# Patient Record
Sex: Male | Born: 1990 | Race: Black or African American | Hispanic: No | Marital: Single | Smoking: Heavy tobacco smoker
Health system: Southern US, Community
[De-identification: ages and names within clinical notes are randomized; demographics above are authoritative.]

## PROBLEM LIST (undated history)

## (undated) DIAGNOSIS — N289 Disorder of kidney and ureter, unspecified: Secondary | ICD-10-CM

## (undated) DIAGNOSIS — G43909 Migraine, unspecified, not intractable, without status migrainosus: Secondary | ICD-10-CM

---

## 2018-04-04 ENCOUNTER — Emergency Department (HOSPITAL_COMMUNITY)
Admission: EM | Admit: 2018-04-04 | Discharge: 2018-04-05 | Disposition: A | Discharge status: Home / Self Care | Payer: Self-pay | Attending: Emergency Medicine | Admitting: Emergency Medicine

## 2018-04-04 ENCOUNTER — Other Ambulatory Visit: Payer: Self-pay

## 2018-04-04 ENCOUNTER — Encounter (HOSPITAL_COMMUNITY): Payer: Self-pay

## 2018-04-04 DIAGNOSIS — R110 Nausea: Secondary | ICD-10-CM | POA: Insufficient documentation

## 2018-04-04 DIAGNOSIS — K920 Hematemesis: Secondary | ICD-10-CM | POA: Insufficient documentation

## 2018-04-04 DIAGNOSIS — R1012 Left upper quadrant pain: Secondary | ICD-10-CM | POA: Insufficient documentation

## 2018-04-04 HISTORY — DX: Migraine, unspecified, not intractable, without status migrainosus: G43.909

## 2018-04-04 HISTORY — DX: Disorder of kidney and ureter, unspecified: N28.9

## 2018-04-04 MED ORDER — GI COCKTAIL ~~LOC~~
30.0000 mL | Freq: Once | ORAL | Status: AC
  Administered 2018-04-04: 30 mL via ORAL
  Filled 2018-04-04: qty 30

## 2018-04-04 MED ORDER — SODIUM CHLORIDE 0.9 % IV BOLUS
1000.0000 mL | Freq: Once | INTRAVENOUS | Status: AC
  Administered 2018-04-04: 1000 mL via INTRAVENOUS

## 2018-04-04 MED ORDER — METOCLOPRAMIDE HCL 5 MG/ML IJ SOLN
10.0000 mg | Freq: Once | INTRAMUSCULAR | Status: AC
  Administered 2018-04-04: 10 mg via INTRAVENOUS
  Filled 2018-04-04: qty 2

## 2018-04-04 NOTE — ED Triage Notes (Signed)
Patient BIB EMS for vomiting that started at about 2000-2100. Patient was concerned due to seeing "blood type stuff" in his vomit, and having a burning sensation is his LUQ. EMS reports there is no blood present with them. Patient admits to smoking and drinking hennessey all day today.   Vitals 132/84 HR 110 RR 18

## 2018-04-04 NOTE — ED Provider Notes (Signed)
[facility]-EMERGENCY DEPT Provider Note  CSN: [id number] Arrival date & time: 04/04/18 2221  Chief Complaint(s) No chief complaint on file.  HPI Michael Golden is a 27 y.o. male   The history is provided by the patient.  Abdominal Pain   This is a new problem. Episode onset: 3-4 hrs. The problem occurs constantly. Progression since onset: fluctuating. The pain is associated with an unknown factor. The pain is located in the LUQ. The quality of the pain is aching, cramping and sharp. The pain is moderate. Associated symptoms include diarrhea (watery, nonbloody), nausea and vomiting. Pertinent negatives include fever, flatus, hematochezia, melena, constipation, dysuria and headaches. Associated symptoms comments: Streaky hematemesis. The symptoms are aggravated by palpation and certain positions. Nothing relieves the symptoms.   Had 5th of liquor all day today. Not a daily drinker. Also used marijuana, daily user.   Past Medical History Past Medical History:  Diagnosis Date  . Migraines   . Renal disorder    Patient states he overused ibuprofen that led to kidney damage   There are no active problems to display for this patient.  Home Medication(s) Prior to Admission medications   Medication Sig Start Date End Date Taking? Authorizing Provider  metoCLOPramide (REGLAN) 10 MG tablet Take 1 tablet (10 mg total) by mouth every 8 (eight) hours as needed for up to 5 days for nausea. 04/05/18 04/10/18  Nira Connardama, Kilea Mccarey Eduardo, MD                                                                                                                                    Past Surgical History History reviewed. No pertinent surgical history. Family History Family History  Problem Relation Age of Onset  . Migraines Mother   . Migraines Father     Social History Social History   Tobacco Use  . Smoking status: Heavy Tobacco Smoker    Packs/day: 1.00    Years: 10.00    Pack  years: 10.00    Types: Cigarettes  . Smokeless tobacco: Never Used  Substance Use Topics  . Alcohol use: Not on file    Comment: 1/5 hennessey per weekend  . Drug use: Yes    Frequency: 20.0 times per week    Types: Marijuana   Allergies Ibuprofen  Review of Systems Review of Systems  Constitutional: Negative for fever.  Gastrointestinal: Positive for abdominal pain, diarrhea (watery, nonbloody), nausea and vomiting. Negative for constipation, flatus, hematochezia and melena.  Genitourinary: Negative for dysuria.  Neurological: Negative for headaches.   All other systems are reviewed and are negative for acute change except as noted in the HPI  Physical Exam Vital Signs  I have reviewed the triage vital signs BP 134/90 (BP Location: Right Arm)   Pulse (!) 109   Temp 98.4 F (36.9 C) (Oral)   Resp 18   Ht 5\' 6"  (1.676 m)   Wt 68 kg (  150 lb)   SpO2 98%   BMI 24.21 kg/m   Physical Exam  Constitutional: He is oriented to person, place, and time. He appears well-developed and well-nourished. No distress.  HENT:  Head: Normocephalic and atraumatic.  Nose: Nose normal.  Eyes: Pupils are equal, round, and reactive to light. Conjunctivae and EOM are normal. Right eye exhibits no discharge. Left eye exhibits no discharge. No scleral icterus.  Neck: Normal range of motion. Neck supple.  Cardiovascular: Normal rate and regular rhythm. Exam reveals no gallop and no friction rub.  No murmur heard. Pulmonary/Chest: Effort normal and breath sounds normal. No stridor. No respiratory distress. He has no rales.  Abdominal: Soft. He exhibits no distension. There is tenderness in the epigastric area, periumbilical area and left upper quadrant. There is no rigidity, no rebound, no guarding and no CVA tenderness. No hernia.  Musculoskeletal: He exhibits no edema or tenderness.  Neurological: He is alert and oriented to person, place, and time.  Skin: Skin is warm and dry. No rash noted. He  is not diaphoretic. No erythema.  Psychiatric: He has a normal mood and affect.  Vitals reviewed.   ED Results and Treatments Labs (all labs ordered are listed, but only abnormal results are displayed) Labs Reviewed  COMPREHENSIVE METABOLIC PANEL - Abnormal; Notable for the following components:      Result Value   Total Protein 8.2 (*)    Total Bilirubin 1.5 (*)    All other components within normal limits  CBC WITH DIFFERENTIAL/PLATELET  LIPASE, BLOOD                                                                                                                         EKG  EKG Interpretation  Date/Time:    Ventricular Rate:    PR Interval:    QRS Duration:   QT Interval:    QTC Calculation:   R Axis:     Text Interpretation:        Radiology No results found. Pertinent labs & imaging results that were available during my care of the patient were reviewed by me and considered in my medical decision making (see chart for details).  Medications Ordered in ED Medications  sodium chloride 0.9 % bolus 1,000 mL (1,000 mLs Intravenous New Bag/Given 04/04/18 2351)  gi cocktail (Maalox,Lidocaine,Donnatal) (30 mLs Oral Given 04/04/18 2353)  metoCLOPramide (REGLAN) injection 10 mg (10 mg Intravenous Given 04/04/18 2352)  Procedures Procedures  (including critical care time)  Medical Decision Making / ED Course I have reviewed the nursing notes for this encounter and the patient's prior records (if available in EHR or on provided paperwork).    Patient with epigastric and left upper quadrant abdominal pain.  No suspicious food intake.  Patient endorsed alcohol and marijuana use today.  Patient with tenderness to palpation of the epigastrium and left upper quadrant.  No signs of peritonitis.  Patient is afebrile with stable vital signs.  Screening labs  grossly reassuring without leukocytosis, significant electrolyte derangements or renal insufficiency.  No biliary obstruction or evidence of pancreatitis.  Patient provided with symptomatic management including IV hydration and Reglan as well as GI cocktail.  Following treatment patient had significant improvement in his symptomatology.  Repeat abdominal exam benign. Low suspicion for serious intra-abdominal inflammatory/infectious process requiring advanced imaging at this time.  Patient able to tolerate oral challenge.  The patient appears reasonably screened and/or stabilized for discharge and I doubt any other medical condition or other [facility] requiring further screening, evaluation, or treatment in the ED at this time prior to discharge.   Final Clinical Impression(s) / ED Diagnoses Final diagnoses:  Left upper quadrant pain  Hematemesis with nausea   Disposition: Discharge  Condition: Good  I have discussed the results, Dx and Tx plan with the patient who expressed understanding and agree(s) with the plan. Discharge instructions discussed at great length. The patient was given strict return precautions who verbalized understanding of the instructions. No further questions at time of discharge.    ED Discharge Orders        Ordered    metoCLOPramide (REGLAN) 10 MG tablet  Every 8 hours PRN     04/05/18 0159       Follow Up: Primary care provider  Schedule an appointment as soon as possible for a visit  If you do not have a primary care physician, contact [facility] at [phone] for referral      This chart was dictated using voice recognition software.  Despite best efforts to proofread,  errors can occur which can change the documentation meaning.   Nira Conn, MD 04/05/18 0200

## 2018-04-04 NOTE — ED Notes (Signed)
Bed: WA21 Expected date:  Expected time:  Means of arrival:  Comments: 425 M Hematemesis

## 2018-04-05 LAB — COMPREHENSIVE METABOLIC PANEL
ALBUMIN: 4.6 g/dL (ref 3.5–5.0)
ALT: 18 U/L (ref 17–63)
ANION GAP: 8 (ref 5–15)
AST: 22 U/L (ref 15–41)
Alkaline Phosphatase: 72 U/L (ref 38–126)
BUN: 12 mg/dL (ref 6–20)
CO2: 26 mmol/L (ref 22–32)
Calcium: 9.2 mg/dL (ref 8.9–10.3)
Chloride: 107 mmol/L (ref 101–111)
Creatinine, Ser: 1.09 mg/dL (ref 0.61–1.24)
GFR calc Af Amer: >60 mL/min (ref >60)
GFR calc non Af Amer: >60 mL/min (ref >60)
GLUCOSE: 85 mg/dL (ref 65–99)
POTASSIUM: 3.7 mmol/L (ref 3.5–5.1)
SODIUM: 141 mmol/L (ref 135–145)
TOTAL PROTEIN: 8.2 g/dL — AB (ref 6.5–8.1)
Total Bilirubin: 1.5 mg/dL — ABNORMAL HIGH (ref 0.3–1.2)

## 2018-04-05 LAB — CBC WITH DIFFERENTIAL/PLATELET
BASOS ABS: 0 10*3/uL (ref 0.0–0.1)
Basophils Relative: 1 %
EOS PCT: 8 %
Eosinophils Absolute: 0.4 10*3/uL (ref 0.0–0.7)
HCT: 45.1 % (ref 39.0–52.0)
Hemoglobin: 15.2 g/dL (ref 13.0–17.0)
LYMPHS PCT: 45 %
Lymphs Abs: 2.4 10*3/uL (ref 0.7–4.0)
MCH: 28.1 pg (ref 26.0–34.0)
MCHC: 33.7 g/dL (ref 30.0–36.0)
MCV: 83.4 fL (ref 78.0–100.0)
MONO ABS: 0.4 10*3/uL (ref 0.1–1.0)
MONOS PCT: 8 %
NEUTROS ABS: 2 10*3/uL (ref 1.7–7.7)
Neutrophils Relative %: 38 %
PLATELETS: 331 10*3/uL (ref 150–400)
RBC: 5.41 MIL/uL (ref 4.22–5.81)
RDW: 13.9 % (ref 11.5–15.5)
WBC: 5.3 10*3/uL (ref 4.0–10.5)

## 2018-04-05 LAB — LIPASE, BLOOD: Lipase: 34 U/L (ref 11–51)

## 2018-04-05 MED ORDER — METOCLOPRAMIDE HCL 10 MG PO TABS: 10.0000 mg | ORAL_TABLET | Freq: Three times a day (TID) | ORAL | 0 refills | Status: DC | PRN

## 2018-04-05 NOTE — Discharge Instructions (Signed)

## 2018-04-05 NOTE — ED Notes (Signed)
Patient tolerated PO Fluids.

## 2019-01-01 ENCOUNTER — Emergency Department (HOSPITAL_COMMUNITY)
Admission: EM | Admit: 2019-01-01 | Discharge: 2019-01-01 | Disposition: A | Discharge status: Home / Self Care | Payer: Self-pay | Attending: Emergency Medicine | Admitting: Emergency Medicine

## 2019-01-01 ENCOUNTER — Emergency Department (HOSPITAL_COMMUNITY): Payer: Self-pay

## 2019-01-01 ENCOUNTER — Other Ambulatory Visit: Payer: Self-pay

## 2019-01-01 DIAGNOSIS — Y999 Unspecified external cause status: Secondary | ICD-10-CM | POA: Insufficient documentation

## 2019-01-01 DIAGNOSIS — S39012A Strain of muscle, fascia and tendon of lower back, initial encounter: Secondary | ICD-10-CM | POA: Insufficient documentation

## 2019-01-01 DIAGNOSIS — Y9389 Activity, other specified: Secondary | ICD-10-CM | POA: Insufficient documentation

## 2019-01-01 DIAGNOSIS — R109 Unspecified abdominal pain: Secondary | ICD-10-CM

## 2019-01-01 DIAGNOSIS — Y929 Unspecified place or not applicable: Secondary | ICD-10-CM | POA: Insufficient documentation

## 2019-01-01 DIAGNOSIS — F17210 Nicotine dependence, cigarettes, uncomplicated: Secondary | ICD-10-CM | POA: Insufficient documentation

## 2019-01-01 DIAGNOSIS — X500XXA Overexertion from strenuous movement or load, initial encounter: Secondary | ICD-10-CM | POA: Insufficient documentation

## 2019-01-01 LAB — CBC WITH DIFFERENTIAL/PLATELET
Abs Immature Granulocytes: 0.02 10*3/uL (ref 0.00–0.07)
BASOS PCT: 0 %
Basophils Absolute: 0 10*3/uL (ref 0.0–0.1)
EOS ABS: 0.3 10*3/uL (ref 0.0–0.5)
EOS PCT: 4 %
HEMATOCRIT: 47.8 % (ref 39.0–52.0)
Hemoglobin: 15.1 g/dL (ref 13.0–17.0)
IMMATURE GRANULOCYTES: 0 %
LYMPHS ABS: 2.2 10*3/uL (ref 0.7–4.0)
Lymphocytes Relative: 31 %
MCH: 27.1 pg (ref 26.0–34.0)
MCHC: 31.6 g/dL (ref 30.0–36.0)
MCV: 85.8 fL (ref 80.0–100.0)
MONOS PCT: 12 %
Monocytes Absolute: 0.9 10*3/uL (ref 0.1–1.0)
NEUTROS PCT: 53 %
Neutro Abs: 3.8 10*3/uL (ref 1.7–7.7)
PLATELETS: 318 10*3/uL (ref 150–400)
RBC: 5.57 MIL/uL (ref 4.22–5.81)
RDW: 13.2 % (ref 11.5–15.5)
WBC: 7.2 10*3/uL (ref 4.0–10.5)
nRBC: 0 % (ref 0.0–0.2)

## 2019-01-01 LAB — COMPREHENSIVE METABOLIC PANEL
ALT: 29 U/L (ref 0–44)
ANION GAP: 11 (ref 5–15)
AST: 21 U/L (ref 15–41)
Albumin: 4 g/dL (ref 3.5–5.0)
Alkaline Phosphatase: 78 U/L (ref 38–126)
BILIRUBIN TOTAL: 1.7 mg/dL — AB (ref 0.3–1.2)
BUN: 12 mg/dL (ref 6–20)
CHLORIDE: 101 mmol/L (ref 98–111)
CO2: 26 mmol/L (ref 22–32)
Calcium: 9.3 mg/dL (ref 8.9–10.3)
Creatinine, Ser: 1.07 mg/dL (ref 0.61–1.24)
Glucose, Bld: 120 mg/dL — ABNORMAL HIGH (ref 70–99)
POTASSIUM: 4.1 mmol/L (ref 3.5–5.1)
Sodium: 138 mmol/L (ref 135–145)
TOTAL PROTEIN: 7.4 g/dL (ref 6.5–8.1)

## 2019-01-01 LAB — URINALYSIS, ROUTINE W REFLEX MICROSCOPIC
BILIRUBIN URINE: NEGATIVE
GLUCOSE, UA: NEGATIVE mg/dL
Ketones, ur: NEGATIVE mg/dL
LEUKOCYTE UA: NEGATIVE
NITRITE: NEGATIVE
PROTEIN: NEGATIVE mg/dL
Specific Gravity, Urine: 1.024 (ref 1.005–1.030)
pH: 5 (ref 5.0–8.0)

## 2019-01-01 MED ORDER — METHOCARBAMOL 500 MG PO TABS
500.0000 mg | ORAL_TABLET | Freq: Once | ORAL | Status: AC
  Administered 2019-01-01: 500 mg via ORAL
  Filled 2019-01-01: qty 1

## 2019-01-01 MED ORDER — SODIUM CHLORIDE 0.9 % IV BOLUS
1000.0000 mL | Freq: Once | INTRAVENOUS | Status: AC
  Administered 2019-01-01: 1000 mL via INTRAVENOUS

## 2019-01-01 MED ORDER — FENTANYL CITRATE (PF) 100 MCG/2ML IJ SOLN
50.0000 ug | Freq: Once | INTRAMUSCULAR | Status: AC
  Administered 2019-01-01: 50 ug via INTRAVENOUS
  Filled 2019-01-01: qty 2

## 2019-01-01 MED ORDER — HYDROCODONE-ACETAMINOPHEN 5-325 MG PO TABS: 1.0000 | ORAL_TABLET | Freq: Four times a day (QID) | ORAL | 0 refills | Status: DC | PRN

## 2019-01-01 MED ORDER — METHOCARBAMOL 500 MG PO TABS: 500.0000 mg | ORAL_TABLET | Freq: Two times a day (BID) | ORAL | 0 refills | Status: DC

## 2019-01-01 NOTE — ED Triage Notes (Signed)
Started with "Kidney Pain" yesterday morning on both sides.  Last similar event about 2013.

## 2019-01-01 NOTE — ED Notes (Signed)
Pt discharged from ED; instructions provided and scripts given; Pt encouraged to return to ED if symptoms worsen and to f/u with PCP; Pt verbalized understanding of all instructions 

## 2019-01-01 NOTE — Discharge Instructions (Signed)
1. Medications: robaxin, vicodin, usual home medications 2. Treatment: rest, drink plenty of fluids, gentle stretching as discussed, alternate ice and heat 3. Follow Up: Please followup with your primary doctor in 3 days for discussion of your diagnoses and further evaluation after today's visit; if you do not have a primary care doctor use the resource guide provided to find one;  Return to the ER for worsening back pain, difficulty walking, loss of bowel or bladder control or other concerning symptoms

## 2019-01-01 NOTE — ED Provider Notes (Signed)
[facility] EMERGENCY DEPARTMENT Provider Note   CSN: [id number] Arrival date & time: 01/01/19  2542    History   Chief Complaint Chief Complaint  Patient presents with  . Flank Pain    HPI Michael Golden is a 28 y.o. male with a hx of migraine headaches, previous AKI secondary to ibuprofen usage presents to the Emergency Department complaining of gradual, persistent, progressively worsening bilateral flank pain onset yesterday morning.  Patient reports 10/10 pain that has made it difficult to sleep.  He reports movement and palpation make the pain significantly worse.  He denies urinary symptoms including dysuria, hematuria, urinary urgency and urinary frequency.  Patient reports he did have some abdominal pain 2 days ago for which he saw his primary care.  He reports at that time he was told that he did have some blood in his urine which he has not had before.  Patient reports his abdominal pain resolved and he has had no additional symptoms.  Patient does report that he does a significant amount of heavy lifting and pulling for work but denies known activities for which he would have injured his back.  He denies numbness, tingling, weakness, loss of bowel or bladder control.  Denies fever, chills, headache, neck pain, nausea, vomiting, diarrhea, weakness, dizziness, syncope.     The history is provided by the patient and medical records. No language interpreter was used.    Past Medical History:  Diagnosis Date  . Migraines   . Renal disorder    Patient states he overused ibuprofen that led to kidney damage    There are no active problems to display for this patient.   No past surgical history on file.      Home Medications    Prior to Admission medications   Medication Sig Start Date End Date Taking? Authorizing Provider  HYDROcodone-acetaminophen (NORCO/VICODIN) 5-325 MG tablet Take 1 tablet by mouth every 6 (six) hours as needed. 01/01/19    Srikar Chiang, Dahlia Client, PA-C  methocarbamol (ROBAXIN) 500 MG tablet Take 1 tablet (500 mg total) by mouth 2 (two) times daily. 01/01/19   Chelsee Hosie, Dahlia Client, PA-C  metoCLOPramide (REGLAN) 10 MG tablet Take 1 tablet (10 mg total) by mouth every 8 (eight) hours as needed for up to 5 days for nausea. Patient not taking: Reported on 01/01/2019 04/05/18 01/01/28  Nira Conn, MD    Family History Family History  Problem Relation Age of Onset  . Migraines Mother   . Migraines Father     Social History Social History   Tobacco Use  . Smoking status: Heavy Tobacco Smoker    Packs/day: 1.00    Years: 10.00    Pack years: 10.00    Types: Cigarettes  . Smokeless tobacco: Never Used  Substance Use Topics  . Alcohol use: Not on file    Comment: 1/5 hennessey per weekend  . Drug use: Yes    Frequency: 20.0 times per week    Types: Marijuana     Allergies   Ibuprofen   Review of Systems Review of Systems  Constitutional: Negative for appetite change, diaphoresis, fatigue, fever and unexpected weight change.  HENT: Negative for mouth sores.   Eyes: Negative for visual disturbance.  Respiratory: Negative for cough, chest tightness, shortness of breath and wheezing.   Cardiovascular: Negative for chest pain.  Gastrointestinal: Negative for abdominal pain, constipation, diarrhea, nausea and vomiting.  Endocrine: Negative for polydipsia, polyphagia and polyuria.  Genitourinary: Positive for flank pain. Negative for  dysuria, frequency, hematuria and urgency.  Musculoskeletal: Positive for back pain. Negative for neck stiffness.  Skin: Negative for rash.  Allergic/Immunologic: Negative for immunocompromised state.  Neurological: Negative for syncope, light-headedness and headaches.  Hematological: Does not bruise/bleed easily.  Psychiatric/Behavioral: Negative for sleep disturbance. The patient is not nervous/anxious.      Physical Exam Updated Vital Signs BP 118/69 (BP  Location: Right Arm) Comment: Simultaneous filing. User may not have seen previous data.  Pulse 63   Temp 98.4 F (36.9 C) (Oral)   Resp 18   Ht 5\' 6"  (1.676 m)   Wt 68 kg   SpO2 100%   BMI 24.21 kg/m   Physical Exam Vitals signs and nursing note reviewed.  Constitutional:      General: He is not in acute distress.    Appearance: He is well-developed. He is not diaphoretic.     Comments: Awake, alert, nontoxic appearance  HENT:     Head: Normocephalic and atraumatic.     Mouth/Throat:     Pharynx: No oropharyngeal exudate.  Eyes:     General: No scleral icterus.    Conjunctiva/sclera: Conjunctivae normal.  Neck:     Musculoskeletal: Normal range of motion and neck supple.     Comments: Full ROM without pain Cardiovascular:     Rate and Rhythm: Normal rate and regular rhythm.  Pulmonary:     Effort: Pulmonary effort is normal. No respiratory distress.     Breath sounds: Normal breath sounds. No wheezing.  Abdominal:     General: Bowel sounds are normal. There is no distension.     Palpations: Abdomen is soft. There is no mass.     Tenderness: There is no abdominal tenderness. There is right CVA tenderness and left CVA tenderness. There is no guarding or rebound.  Musculoskeletal: Normal range of motion.     Comments: Full range of motion T-spine and L-spine with pain.  No midline tenderness to palpation of the C-spine, T-spine or L-spine.  Tenderness to palpation along the bilateral flanks without ecchymosis or swelling.  Lymphadenopathy:     Cervical: No cervical adenopathy.  Skin:    General: Skin is warm and dry.     Findings: No erythema or rash.  Neurological:     Mental Status: He is alert.     Comments: Speech is clear and goal oriented, follows commands Normal 5/5 strength in upper and lower extremities bilaterally including dorsiflexion and plantar flexion, strong and equal grip strength Sensation normal to light and sharp touch Moves extremities without  ataxia, coordination intact Normal gait Normal balance  Psychiatric:        Behavior: Behavior normal.      ED Treatments / Results  Labs (all labs ordered are listed, but only abnormal results are displayed) Labs Reviewed  COMPREHENSIVE METABOLIC PANEL - Abnormal; Notable for the following components:      Result Value   Glucose, Bld 120 (*)    Total Bilirubin 1.7 (*)    All other components within normal limits  URINALYSIS, ROUTINE W REFLEX MICROSCOPIC - Abnormal; Notable for the following components:   Hgb urine dipstick MODERATE (*)    Bacteria, UA RARE (*)    All other components within normal limits  CBC WITH DIFFERENTIAL/PLATELET     Radiology Ct Renal Stone Study  Result Date: 01/01/2019 CLINICAL DATA:  Initial evaluation for acute bilateral flank pain, stone disease suspected. EXAM: CT ABDOMEN AND PELVIS WITHOUT CONTRAST TECHNIQUE: Multidetector CT imaging of the  abdomen and pelvis was performed following the standard protocol without IV contrast. COMPARISON:  None. FINDINGS: Lower chest: Visualized lung bases are clear. Hepatobiliary: Liver demonstrates a normal unenhanced appearance. Gallbladder normal. No biliary dilatation. Pancreas: Pancreas within normal limits. Spleen: Spleen within normal limits. Adrenals/Urinary Tract: Adrenal glands are normal. Kidneys equal in size without evidence for nephrolithiasis or hydronephrosis. No radiopaque calculi seen along the course of either renal collecting system. No hydroureter. Bladder largely decompressed without acute finding. No layering stones within the bladder lumen. Stomach/Bowel: Stomach within normal limits. No evidence for bowel obstruction. Normal appendix. No acute inflammatory changes seen about the bowels. Vascular/Lymphatic: Intra-abdominal aorta of normal caliber. No adenopathy. Reproductive: Prostate within normal limits. Other: No free air or fluid. Musculoskeletal: No acute osseous abnormality. No discrete lytic  or blastic osseous lesions. IMPRESSION: 1. No CT evidence for nephrolithiasis, ureterolithiasis, or obstructive uropathy. 2. No other acute intra-abdominal or pelvic process. Electronically Signed   By: Rise Mu M.D.   On: 01/01/2019 06:05    Procedures Procedures (including critical care time)  Medications Ordered in ED Medications  fentaNYL (SUBLIMAZE) injection 50 mcg (50 mcg Intravenous Given 01/01/19 0534)  sodium chloride 0.9 % bolus 1,000 mL (1,000 mLs Intravenous New Bag/Given 01/01/19 0530)  methocarbamol (ROBAXIN) tablet 500 mg (500 mg Oral Given 01/01/19 6553)     Initial Impression / Assessment and Plan / ED Course  I have reviewed the triage vital signs and the nursing notes.  Pertinent labs & imaging results that were available during my care of the patient were reviewed by me and considered in my medical decision making (see chart for details).        Presents to the emergency department with bilateral flank pain.  He reports a history of AKI secondary to large volume ibuprofen usage but reports that since that time he has not taken any ibuprofen.  Serum creatinine is within normal limits today at 1.07.  Glucose is slightly elevated at 120.  Slightly elevated total bili however this appears to be baseline for patient.  Urinalysis shows moderate hemoglobin rare bacteria but no evidence of urinary tract infection.  No leukocytosis.  CT renal is without evidence of nephrolithiasis or obstructive uropathy.  Patient has been able to urinate here in the emergency department and there is no signs of urinary retention.  Suspect musculoskeletal pain due to heavy lifting and pulling.  Patient's pain treated here in the emergency department.  He is able to ambulate without difficulty.  Final Clinical Impressions(s) / ED Diagnoses   Final diagnoses:  Bilateral flank pain  Strain of lumbar region, initial encounter    ED Discharge Orders         Ordered    methocarbamol  (ROBAXIN) 500 MG tablet  2 times daily     01/01/19 0640    HYDROcodone-acetaminophen (NORCO/VICODIN) 5-325 MG tablet  Every 6 hours PRN     01/01/19 0641           Kazandra Forstrom, Dahlia Client, PA-C 01/01/19 7482    Shaune Pollack, MD 01/02/19 1902

## 2019-07-22 ENCOUNTER — Telehealth: Payer: Self-pay | Admitting: *Deleted

## 2019-07-22 DIAGNOSIS — Z20828 Contact with and (suspected) exposure to other viral communicable diseases: Secondary | ICD-10-CM

## 2019-07-22 DIAGNOSIS — Z20822 Contact with and (suspected) exposure to covid-19: Secondary | ICD-10-CM

## 2019-07-22 NOTE — Telephone Encounter (Signed)
Pt was at work at [employer] this morning for approx 20 min when he developed sudden onset n/v/d. One episode each of v/d at work. He presented to supervisor who sent him home. Pt had WNL temp at checkpoint this morning prior to entering building. He endorses muted sense of taste while brushing his teeth this morning. Denies loss of smell. Denies HA, sinus pain/pressure, otalgia, sore throat, fatigue, muscle aches, nasal or chest congestion. Denies any known exposure to Covid-19 or attending any large gatherings in the past 2 weeks.   Pt advised COVID testing is recommended and that per company policy, he will be out of work on [employer] for 14 days. Pt is agreeable to testing. RN advised pt that since today is Day 1 of sx, testing is not advisable today or tomorrow. Due to testing sites being closed on weekends, asked pt to have testing performed on Monday 07/26/2019. He is agreeable. Directions given to preferred testing site of [place] in [place] with instructions to remain in vehicle and wear mask. Quarantine instructions given. Reviewed possible sx that could develop including ShOB, fever, HA, fatigue, muscle aches, sore throat, and when to seek same day or emergent care. Pt verbalizes understanding and agreement and has no further questions at this time. Will inform pt of results once received and have HR contact him regarding benefits and return to work date/instructions. Pt encouraged to call clinic with any questions or concerns that may arise.

## 2019-07-22 NOTE — Telephone Encounter (Signed)
Noted agreed with plan of care and order signed electronically.

## 2019-07-26 ENCOUNTER — Other Ambulatory Visit: Payer: Self-pay

## 2019-07-26 DIAGNOSIS — Z20822 Contact with and (suspected) exposure to covid-19: Secondary | ICD-10-CM

## 2019-07-27 LAB — NOVEL CORONAVIRUS, NAA: SARS-CoV-2, NAA: NOT DETECTED

## 2019-07-29 NOTE — Telephone Encounter (Signed)
Pt tested on 07/26/19, negative result 07/27/19. Pt viewed in MyChart per chart review. Tim Watson/HR notified of negative test on 07/28/2019 by phone and he reported having already spoken with pt regarding return to work plan.

## 2019-07-29 NOTE — Telephone Encounter (Signed)
Noted.  Agree with plan of care regarding patient to follow his company guidelines regarding return to work onsite in [place] per CDC/company protocol.  Patient previously briefed on covid symptoms to report to clinic staff and ER precautions.

## 2019-08-06 ENCOUNTER — Telehealth: Payer: Self-pay | Admitting: *Deleted

## 2019-08-06 DIAGNOSIS — Z20822 Contact with and (suspected) exposure to covid-19: Secondary | ICD-10-CM

## 2019-08-06 NOTE — Telephone Encounter (Signed)
Noted agree with plan of care.  ER precautions if vomiting blood, unable to tolerate clear liquids and void every 8 hours or worsening abdomen pain with or without black or bright red blood in stools.  Continue to monitor for other COVID symptoms and consider repeat covid testing if others in household/contacts symptomatic and/or not following safe practices.

## 2019-08-06 NOTE — Telephone Encounter (Signed)
RN was informed by Environmental consultant that pt presented to the temp/sx check point this morning to enter work but vomited upon approaching it. He was turned around and instructed to return home and contact HR. RN called pt and he reported 2 episodes of vomiting this morning on his way into work. Felt fine prior to leaving for work. Sudden onset of sx. Only n/v. Denies diarrhea, fatigue, body aches, cough, ShOB, sinus sx, etc. He vomited once more after returning home and then took a nap. Just woke up but feeling better. No additional vomiting yet. Nausea is mild. Pt was tested for Covid on 07/26/19 and was out of work for 14 days based on previous sx starting 07/22/19. Was supposed to return to work today. Previous sx had resolved by test date and he had no further or new sx during his quarantine.  Pt reports he ate take out pizza late last night. Wonders if bad food could be causing sx, or is a short term stomach virus. Pt scheduled to work today and tomorrow, then off Sunday and Monday. Advised pt to remain out of work today and Sales promotion account executive will call him on Monday to re-evaluate sx. As he is already feeling better this morning, if sx remain improved and no new sx develop, may be able to clear to return to work on Tuesday. Will discuss with NP Betancourt Monday after reevaluating pt over phone.  Advised pt of BRAT diet and small sips of water or electrolyte drink every 39min as fluid challenge. Avoid large intakes of fluids. Once able to tolerate 1 hour of fluid intake without n/v, may progress to bland food challenge. Again reviewed possible mild covid sx and more severe sx that would require same day treatment. Instructed to contact clinic if new sx occur or he had any questions or concerns. He verbalizes understanding and agreement with plan of care. No further questions. HR Hyacinth Meeker made aware of plan and is agreeable.

## 2019-08-09 NOTE — Telephone Encounter (Signed)
Per NP Betancourt, send for retesting. Pt advised to go today before 3:30 or tomorrow 8:30-3:30. Same location as previous, [employer]. NP to call pt tomorrow. Pt advised to answer private number. Will evaluate return to work date based on results and current sx when NP speaks with pt. He verbalizes understanding and agreement.

## 2019-08-09 NOTE — Telephone Encounter (Signed)
RN called pt this morning to check on sx. He reports the n/v resolved on Friday a few hours after he returned home. He initially denied diarrhea but today stated it had been occurring since Friday as well. Sts it is less watery than previous but still loose stools, 2-3 episodes in past 24 hours. Denies abd pain, cramping. No new or additional sx. He is able to tolerate fluids and food since Friday evening. Denies anyone else in household with similar sx or any illness. Reports everyone in household is abiding by covid precautions, avoiding large gatherings, wearing masks in public, keeping 15ft distance, etc.  Please advise on recommendations for repeat covid testing or return to work. Pt scheduled to return to work tomorrow if [employer] testing not indicated and sx do not worsen. He currently feels able to return if no testing indicated.

## 2019-08-09 NOTE — Telephone Encounter (Signed)
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  Discussed patient is not to return to work until vomiting/diarrhea resolved and no new symptoms consistent with covid e.g. fever, chills, body aches, runny nose, loss of taste or smell, sore throat.  Patient will remain out of work until test results received and if negative and his acute symptoms resolved will be able to return to work wearing mask, staying 6 feet apart from others and frequent handwashing/hand sanitizing.  He was instructed to notify clinic staff if any new symptoms via telephone.  If new symptoms quarantine will be extended.  HR Rep Hyacinth Meeker notified patient placed on home quarantine and SARS covid 19 PCR testing ordered today via telephone.

## 2019-08-10 ENCOUNTER — Other Ambulatory Visit: Payer: Self-pay

## 2019-08-10 DIAGNOSIS — Z20822 Contact with and (suspected) exposure to covid-19: Secondary | ICD-10-CM

## 2019-08-12 LAB — NOVEL CORONAVIRUS, NAA: SARS-CoV-2, NAA: NOT DETECTED

## 2019-08-15 ENCOUNTER — Encounter: Payer: Self-pay | Admitting: *Deleted

## 2019-08-15 NOTE — Telephone Encounter (Signed)
Second covid PCR test results still pending from last week.  Patient scheduled to return to work Tuesday from quarantine per [employer] HR Rep [employer].

## 2019-08-17 NOTE — Telephone Encounter (Signed)
See RN encounter note 08/17/19. Test resulted 08/12/19 but did not cross over to [facility]. [facility] confirmed negative result and faxed result to clinic. Pt to return to work tomorrow.

## 2019-08-17 NOTE — Telephone Encounter (Signed)
Noted agree with plan of care 

## 2019-08-17 NOTE — Telephone Encounter (Signed)
Covid test result still pending after 1 week. RN called [facility] to check status. They report test resulted on 08/12/19 as negative. Result has not crossed over to [facility]. [facility] faxed copy of result to clinic. Pt made aware of results over phone, as well as HR Hyacinth Meeker. Pt reports all sx resolved 4-5 days ago. No current complaints. Plan for pt to return to work tomorrow, 08/18/19.

## 2019-08-18 NOTE — Telephone Encounter (Signed)
noted 

## 2019-09-02 ENCOUNTER — Encounter: Payer: Self-pay | Admitting: Registered Nurse

## 2019-09-02 ENCOUNTER — Ambulatory Visit: Payer: Self-pay | Admitting: Registered Nurse

## 2019-09-02 ENCOUNTER — Other Ambulatory Visit: Payer: Self-pay

## 2019-09-02 VITALS — BP 118/75 | HR 76 | Temp 98.6°F

## 2019-09-02 DIAGNOSIS — M7651 Patellar tendinitis, right knee: Secondary | ICD-10-CM

## 2019-09-02 MED ORDER — ACETAMINOPHEN 500 MG PO TABS: 1000.0000 mg | ORAL_TABLET | Freq: Four times a day (QID) | ORAL | 0 refills | Status: AC | PRN

## 2019-09-02 NOTE — Patient Instructions (Addendum)
Patellar Tendinitis Rehab Ask your health care provider which exercises are safe for you. Do exercises exactly as told by your health care provider and adjust them as directed. It is normal to feel mild stretching, pulling, tightness, or discomfort as you do these exercises. Stop right away if you feel sudden pain or your pain gets worse. Do not begin these exercises until told by your health care provider. Stretching and range-of-motion exercise This exercise warms up your muscles and joints and improves the movement and flexibility of your knee. The exercise also helps to relieve pain and stiffness. Hamstring, doorway stretch This is an exercise in which you lie in a doorway and prop your leg on a wall to stretch the back of your knee and thigh (hamstring). 1. Lie on your back in front of a doorway with your left / right leg resting against the wall and your other leg flat on the floor in the doorway. There should be a slight bend in your left / right knee. 2. Straighten your left / right knee. You should feel a stretch behind your knee or thigh. If you do not, scoot your buttocks closer to the door. 3. Hold this position for _____5_____ seconds. Repeat _____3_____ times. Complete this exercise ___2_______ times a day. Strengthening exercises These exercises build strength and endurance in your knee. Endurance is the ability to use your muscles for a long time, even after they get tired. Quadriceps, isometric This exercise stretches the muscles in front of your thigh (quadriceps) without moving your knee joint (isometric). 1. Lie on your back with your left / right leg extended and your other knee bent. 2. Slowly tense the muscles in the front of your left / right thigh. When you do this, you should see your kneecap slide up toward your hip or see increased dimpling just above the knee. This motion will push the back of your knee toward the floor. If this is painful, try putting a rolled-up hand  towel under your knee to support it in a bent position. Change the size of the towel to find a position that allows you to do this exercise without any pain. 3. For ____5______ seconds, hold the muscle as tight as you can without increasing your pain. 4. Relax the muscles slowly and completely. Repeat _____3_____ times. Complete this exercise ___2_______ times a day. Straight leg raises This exercise stretches the muscles in front of your thigh (quadriceps) and the muscles that move your hips (hip flexors). 1. Lie on your back with your left / right leg extended and your other knee bent. 2. Tense the muscles in the front of your left / right thigh. When you do this, you should see your kneecap slide up or see increased dimpling just above the knee. 3. Keep these muscles tight as you raise your leg 4-6 inches (10-15 cm) off the floor. Do not let your moving knee bend. 4. Hold this position for ____5______ seconds. 5. Keep these muscles tense as you slowly lower your leg. 6. Relax your muscles slowly and completely. Repeat ____3______ times. Complete this exercise _____2____ times a day. Squats This is a weight-bearing exercise in which you bend your knees and lower your hips while engaging your thigh muscles. 1. Stand in front of a table, with your feet and knees pointing straight ahead. You may rest your hands on the table for balance but not for support. 2. Slowly bend your knees and lower your hips like you are going to  sit in a chair. ? Keep your weight over your heels, not over your toes. ? Keep your lower legs upright so they are parallel with the table legs. ? Do not let your hips go lower than your knees. ? Do not bend lower than told by your health care provider. ? If your knee pain increases, do not bend as low. 3. Hold the squat position for ____5______ seconds. 4. Slowly push with your legs to return to standing. Do not use your hands to pull yourself to standing. Repeat ___3______  times. Complete this exercise ____2______ times a day. Step-downs This is an exercise in which you step down slowly while engaging your leg muscles. 1. Stand on the edge of a step. 2. Keeping your weight over your _____right_____ heel, slowly bend your _____right_____ knee to bring your ____right______ heel toward the floor. Lower your heel as far as you can while keeping control and without increasing any discomfort. ? Do not let your __right________ knee come forward. ? Use your leg muscles, not gravity, to lower your body. ? Hold a wall or rail for balance if needed. 3. Slowly push through your heel to lift your body weight back up. 4. Return to the starting position. Repeat _____3_____ times. Complete this exercise _____2_____ times a day. Straight leg raises This exercise strengthens the muscles that rotate the leg at the hip and move it away from your body (hip abductors). 1. Lie on your side with your left / right leg in the top position. Lie so your head, shoulder, knee, and hip line up. You may bend your lower knee to help you keep your balance. 2. Roll your hips slightly forward, so that your hips are stacked directly over each other and your left / right knee is facing forward. 3. Leading with your heel, lift your top leg 4-6 inches (10-15 cm). You should feel the muscles in your outer hip lifting. ? Do not let your foot drift forward. ? Do not let your knee roll toward the ceiling. 4. Hold this position for ___5______ seconds. 5. Slowly lower your leg to the starting position. 6. Let your muscles relax completely after each repetition. Repeat ____3______ times. Complete this exercise ___2_______ times a day. This information is not intended to replace advice given to you by your health care provider. Make sure you discuss any questions you have with your health care provider. Document Released: 09/30/2005 Document Revised: 01/21/2019 Document Reviewed: 07/21/2018 Elsevier Patient  Education  2020 [employer] Patellar Tendinitis  Patellar tendinitis is also called jumper's knee or patellar tendinopathy. This condition happens when there is damage to and inflammation of the patellar tendon. Tendons are cord-like tissues that connect muscles to bones. The patellar tendon connects the bottom of the kneecap (patella) to the top of the shin bone (tibia). Patellar tendinitis causes pain in the front of the knee. The condition happens in the following stages:  Stage 1: In this stage, you have pain only after activity.  Stage 2: In this stage, you have pain during and after activity.  Stage 3: In this stage, you have pain at rest as well as during and after activity. The pain limits your ability to do the activity.  Stage 4: In this stage, the tendon tears and severely limits your activity. What are the causes? This condition is caused by repeated (repetitive) stress on the tendon. This stress may cause the tendon to stretch, swell, thicken, or tear. What increases the risk? The following  factors may make you more likely to develop this condition:  Participating in sports that involve running, kicking, and jumping, especially on hard surfaces. These include: ? Basketball. ? Volleyball. ? Soccer. ? Track and field.  Training too hard.  Having tight thigh muscles.  Having received steroid injections in the tendon.  Having had knee surgery.  Being 61-59 years old.  Having rheumatoid arthritis, diabetes, or kidney disease. These conditions interrupt blood flow to the knee, causing the tendon to weaken. What are the signs or symptoms? The main symptom of this condition is pain and swelling in the front of the knee. The pain usually starts slowly and gradually gets worse. It may become painful to straighten your leg. The pain may get worse when you walk, run, or jump. How is this diagnosed? This condition may be diagnosed based on:  Your symptoms.  Your medical  history.  A physical exam. During the physical exam, your health care provider may check for: ? Tenderness in your patella. ? Tightness in your thigh muscles. ? Pain when you straighten your knee.  Imaging tests, including: ? X-rays. These will show the position and condition of your patella. ? An MRI. This will show any abnormality of the tendon. ? Ultrasound. This will show any swelling or other abnormalities of the tendon. How is this treated? Treatment for this condition depends on the stage of the condition. It may involve:  Avoiding activities that cause pain, such as jumping.  Icing and elevating your knee.  Having sound wave stimulation to promote healing.  Doing stretching and strengthening exercises (physical therapy) when pain and swelling improve.  Wearing a knee brace. This may be needed if your condition does not improve with treatment.  Using crutches or a walker. This may be needed if your condition does not improve with treatment.  Surgery. This may be done if you have stage 4 tendinitis. Follow these instructions at home: If you have a brace:  Wear the brace as told by your health care provider. Remove it only as told by your health care provider.  Loosen the brace if your toes tingle, become numb, or turn cold and blue.  Keep the brace clean.  If the brace is not waterproof: ? Do not let it get wet. ? Cover it with a watertight covering when you take a bath or shower.  Ask your health care provider when it is safe for you to drive. Managing pain, stiffness, and swelling   If directed, put ice on the injured area. ? If you have a removable brace, remove it as told by your health care provider. ? Put ice in a plastic bag. ? Place a towel between your skin and the bag. ? Leave the ice on for 20 minutes, 2-3 times a day.  Move your toes often to reduce stiffness and swelling.  Raise (elevate) your knee above the level of your heart while you are  sitting or lying down. Activity  Do not use the injured limb to support your body weight until your health care provider says that you can. Use your crutches or a walker as told by your health care provider.  Return to your normal activities as told by your health care provider. Ask your health care provider what activities are safe for you.  Do exercises as told by your health care provider or physical therapist. General instructions  Take over-the-counter and prescription medicines only as told by your health care provider.  Do not  use any products that contain nicotine or tobacco, such as cigarettes, e-cigarettes, and chewing tobacco. These can delay healing. If you need help quitting, ask your health care provider.  Keep all follow-up visits as told by your health care provider. This is important. How is this prevented?  Warm up and stretch before being active.  Cool down and stretch after being active.  Give your body time to rest between periods of activity. ? You may need to reduce how often you play a sport that requires frequent jumping.  Make sure to use equipment that fits you.  Be safe and responsible while being active. This will help you avoid falls which can damage the tendon.  Do at least 150 minutes of moderate-intensity exercise each week, such as brisk walking or water aerobics.  Maintain physical fitness, including: ? Strength. ? Flexibility. ? Cardiovascular fitness. ? Endurance. Contact a health care provider if:  Your symptoms have not improved in 6 weeks.  Your symptoms get worse. Summary  Patellar tendinitis is also called jumper's knee or patellar tendinopathy. This condition happens when there is damage to and inflammation of the patellar tendon.  Treatment for this condition depends on the stage of the condition and may include rest, ice, exercises, medicines, and surgery.  Do not use the injured limb to support your body weight until your  health care provider says that you can.  Take over-the-counter and prescription medicines only as told by your health care provider.  Keep all follow-up visits as told by your health care provider. This is important. This information is not intended to replace advice given to you by your health care provider. Make sure you discuss any questions you have with your health care provider. Document Released: 09/30/2005 Document Revised: 01/21/2019 Document Reviewed: 08/24/2018 Elsevier Patient Education  2020 [employer].

## 2019-09-02 NOTE — Progress Notes (Signed)
Subjective:    Patient ID: Michael Golden, male    DOB: 1990/12/16, 28 y.o.   MRN: [id number]  27y/o male African-American newly established pt c/o R knee pain x2 days. No known injury. Feels like knee is "giving out and I have to catch myself before I fall. I had to take a knee while I was walking the other day." Notices pain and weakness in right knee mostly with upward movement, eg climbing ladders, moving from sitting to standing position. Points to source of pain as just proximal to patella. Feels like patella shifts more than it should. Has not done any home treatment. Applied Biofreeze in clinic this am from [employer].  Does not have reusable ice pack at home.  Denied previous injury to knees.   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 Negative for appetite change, chills, diaphoresis, fatigue and fever.  HENT: Negative for trouble swallowing and voice change.   Eyes: Negative for photophobia and visual disturbance.  Respiratory: Negative for cough, shortness of breath, wheezing and stridor.   Cardiovascular: Negative for chest pain, palpitations and leg swelling.  Gastrointestinal: Negative for abdominal pain, diarrhea, nausea and vomiting.  Endocrine: Negative for cold intolerance and heat intolerance.  Genitourinary: Negative for difficulty urinating and enuresis.  Musculoskeletal: Positive for arthralgias, gait problem, joint swelling and myalgias. Negative for back pain, neck pain and neck stiffness.  Skin: Negative for color change, pallor, rash and wound.  Allergic/Immunologic: Negative for food allergies.  Neurological: Positive for weakness. Negative for dizziness, tremors, seizures, syncope, facial asymmetry, speech difficulty, light-headedness,  numbness and headaches.  Hematological: Negative for adenopathy. Does not bruise/bleed easily.  Psychiatric/Behavioral: Negative for agitation, confusion and sleep disturbance.       Objective:   Physical Exam Vitals signs and nursing note reviewed.  Constitutional:      General: He is awake. He is not in acute distress.    Appearance: Normal appearance. He is well-developed, well-groomed and normal weight. He is not ill-appearing, toxic-appearing or diaphoretic.  HENT:     Head: Normocephalic and atraumatic.     Jaw: There is normal jaw occlusion.     Right Ear: Hearing and external ear normal.     Left Ear: Hearing and external ear normal.     Nose: Nose normal.     Mouth/Throat:     Lips: Pink. No lesions.     Mouth: Mucous membranes are moist.     Tongue: No lesions. Tongue does not deviate from midline.     Palate: No mass and lesions.     Pharynx: Oropharynx is clear. Uvula midline.  Eyes:     General: Lids are normal. Vision grossly intact. Gaze aligned appropriately. No visual field deficit or scleral icterus.       Right eye: No discharge.        Left eye: No discharge.     Extraocular Movements: Extraocular movements intact.     Conjunctiva/sclera: Conjunctivae normal.     Pupils: Pupils are equal, round, and reactive to light.  Neck:     Musculoskeletal: Normal range of motion and neck supple. Normal range of motion. No edema, erythema, neck rigidity, crepitus, injury, pain with movement or torticollis.     Thyroid: No thyromegaly.     Trachea: Trachea  normal.  Cardiovascular:     Rate and Rhythm: Normal rate and regular rhythm.     Pulses: Normal pulses.          Radial pulses are 2+ on the right side and 2+ on the left side.     Heart sounds: Normal heart sounds. No murmur. No friction rub. No gallop.   Pulmonary:     Effort: Pulmonary effort is normal. No respiratory distress.     Breath sounds: Normal breath sounds and air entry. No stridor, decreased air  movement or transmitted upper airway sounds. No decreased breath sounds, wheezing, rhonchi or rales.     Comments: Wearing cloth mask due to covid 19 pandemic; patient spoke full sentences without difficulty; no cough observed in exam room Abdominal:     General: Abdomen is flat.     Palpations: Abdomen is soft.  Musculoskeletal:        General: Tenderness present. No swelling or deformity.     Right shoulder: Normal.     Left shoulder: Normal.     Right elbow: Normal.    Left elbow: Normal.     Right hip: Normal.     Left hip: Normal.     Right knee: He exhibits decreased range of motion. He exhibits no swelling, no effusion, no ecchymosis, no deformity, no laceration, no erythema, normal alignment, no LCL laxity, normal patellar mobility, no bony tenderness, normal meniscus and no MCL laxity. Tenderness found. Patellar tendon tenderness noted. No medial joint line, no lateral joint line, no MCL and no LCL tenderness noted.     Left knee: He exhibits decreased range of motion. He exhibits no swelling, no effusion, no ecchymosis, no deformity, no laceration, no erythema, normal alignment, no LCL laxity, normal patellar mobility, no bony tenderness, normal meniscus and no MCL laxity. Tenderness found. Patellar tendon tenderness noted. No medial joint line, no lateral joint line, no MCL and no LCL tenderness noted.     Right ankle: Normal.     Left ankle: Normal.     Cervical back: Normal.     Thoracic back: Normal.     Lumbar back: Normal.     Right hand: Normal.     Left hand: Normal.     Right upper leg: He exhibits tenderness. He exhibits no bony tenderness, no swelling, no edema and no deformity.     Left upper leg: He exhibits tenderness. He exhibits no bony tenderness, no swelling, no edema, no deformity and no laceration.     Right lower leg: No edema.     Left lower leg: No edema.       Legs:  Lymphadenopathy:     Head:     Right side of head: No submental, submandibular,  tonsillar, preauricular, posterior auricular or occipital adenopathy.     Left side of head: No submental, submandibular, tonsillar, preauricular, posterior auricular or occipital adenopathy.     Cervical: No cervical adenopathy.     Right cervical: No superficial cervical adenopathy.    Left cervical: No superficial cervical adenopathy.  Skin:    General: Skin is warm and dry.     Capillary Refill: Capillary refill takes less than 2 seconds.     Coloration: Skin is not ashen, cyanotic, jaundiced, mottled, pale or sallow.     Findings: No abrasion, abscess, acne, bruising, burn, ecchymosis, erythema, laceration, lesion, petechiae, rash or wound.     Nails: There is no clubbing.   Neurological:     General:  No focal deficit present.     Mental Status: He is alert and oriented to person, place, and time. Mental status is at baseline.     GCS: GCS eye subscore is 4. GCS verbal subscore is 5. GCS motor subscore is 6.     Cranial Nerves: Cranial nerves are intact. No cranial nerve deficit, dysarthria or facial asymmetry.     Sensory: Sensation is intact. No sensory deficit.     Motor: Motor function is intact. No weakness, tremor, atrophy, abnormal muscle tone or seizure activity.     Coordination: Coordination is intact. Coordination normal.     Gait: Gait abnormal.     Comments: Limping when initially standing from sitting; sitting to standing slow due to knee pain; gait steady in hallway; able to get on/off exam table and in/out chair without assistance steady but slow  Psychiatric:        Attention and Perception: Attention and perception normal.        Mood and Affect: Mood and affect normal.        Speech: Speech normal.        Behavior: Behavior normal. Behavior is cooperative.        Thought Content: Thought content normal.        Cognition and Memory: Cognition and memory normal.        Judgment: Judgment normal.       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  Discussed with patient new job kind of like starting a new exercise program and he ramped up really quickly not gradually on ladder climbing which is probably contributing to his patellar tendon and quadriceps inflammation as these take the bulk of body weight moving up and down stairs.  Cannot use nsaids.  Tylenol 1000mg  po QID prn pain.  Given 1 reusable ice pack from clinic stock apply 15 minutes QID prn pain/swelling cryotherapy.  Discuss with supervisor decreasing ladder work to 5 per hour instead of 10 until tendonitis flares down/work with coworkers to evenly splint pulls from shelves not requiring ladder.  Consider knee pads or sitting/lying on his side to pull from lower shelves instead of squatting or kneeing as will probably worsen knee pain at this time.  Printed and patient given exitcare handout on patellar tendonitis and rehab enxercises.  Reviewed/demonstrated exercises with patient and he is to start with quadriceps activation and straight leg raises.   Discussed do not start exercise program with walking/jumping at this time until tendonitis flares down and will gradually need to ramp up.  If work restrictions required will need [facility].  Discuss with supervisor to get some more pulling jobs on lower shelves not requiring ladder for every pull to help with decreasing tendonitis flare.  Elevate legs on break and when at home. Patient was  instructed to rest, ice and elevate leg. Medications as directed.  Call or return to clinic as needed if these symptoms worsen or fail to improve as anticipated. Trial home  exercise program per [employer].  Consider knee neoprene sleeve or ace bandage use and crutches if no improvement next 48 hours.  Patient verbalized agreement and understanding of treatment plan and had no further questions at this time.   P2: ROM exercises, Stretching, and Hand out given

## 2019-09-04 ENCOUNTER — Encounter (HOSPITAL_COMMUNITY): Payer: Self-pay

## 2019-09-04 ENCOUNTER — Other Ambulatory Visit: Payer: Self-pay

## 2019-09-04 ENCOUNTER — Ambulatory Visit (HOSPITAL_COMMUNITY)
Admission: EM | Admit: 2019-09-04 | Discharge: 2019-09-04 | Disposition: A | Discharge status: Home / Self Care | Payer: Worker's Compensation | Attending: Family Medicine | Admitting: Family Medicine

## 2019-09-04 DIAGNOSIS — M222X1 Patellofemoral disorders, right knee: Secondary | ICD-10-CM | POA: Diagnosis not present

## 2019-09-04 MED ORDER — PREDNISONE 5 MG PO TABS: ORAL_TABLET | ORAL | 0 refills | Status: DC

## 2019-09-04 NOTE — ED Triage Notes (Signed)
Pt present right knee pain. Pt states that early today he was stepping on the ladder and his right knee gave out on him. So his job had him to come in to check on his knee.

## 2019-09-04 NOTE — Discharge Instructions (Signed)
Please try ice  Please try the brace Please try the medicine  Please follow up in 2 weeks if your symptoms have not improved

## 2019-09-04 NOTE — ED Provider Notes (Signed)
[place]    CSN: [id number] Arrival date & time: 09/04/19  1203      History   Chief Complaint Chief Complaint  Patient presents with   Knee Pain    right     HPI Michael Golden is a 28 y.o. male. He is presenting with right knee pain. The pain is  Acute in nature. Pain is worse with walking and climbing stairs. Pain is anterior in nature. No prior history of similar pain. No inciting event or trauma.  He felt like his knee was going to give out while he was climbing a ladder.  Pain is sharp and stabbing.  Pain is localized to the knee.  Pain is moderate to severe.  No prior history of surgery  HPI  Past Medical History:  Diagnosis Date   Migraines    Renal disorder    Patient states he overused ibuprofen that led to kidney damage    There are no active problems to display for this patient.   History reviewed. No pertinent surgical history.     Home Medications    Prior to Admission medications   Medication Sig Start Date End Date Taking? Authorizing Provider  acetaminophen (TYLENOL) 500 MG tablet Take 2 tablets (1,000 mg total) by mouth every 6 (six) hours as needed for up to 3 days for mild pain or moderate pain. 09/02/19 09/05/19  Betancourt, Aura Fey, NP  HYDROcodone-acetaminophen (NORCO/VICODIN) 5-325 MG tablet Take 1 tablet by mouth every 6 (six) hours as needed. 01/01/19   [name], [name] Soho, PA-C  methocarbamol (ROBAXIN) 500 MG tablet Take 1 tablet (500 mg total) by mouth 2 (two) times daily. 01/01/19   [name], [name] Soho, PA-C  metoCLOPramide (REGLAN) 10 MG tablet Take 1 tablet (10 mg total) by mouth every 8 (eight) hours as needed for up to 5 days for nausea. Patient not taking: Reported on 01/01/2019 04/05/18 01/01/28  [name], MD  predniSONE (DELTASONE) 5 MG tablet Take 6 pills for first day, 5 pills second day, 4 pills third day, 3 pills fourth day, 2 pills the fifth day, and 1 pill sixth day. 09/04/19   [name], MD     Family History Family History  Problem Relation Age of Onset   Migraines Mother    Migraines Father     Social History Social History   Tobacco Use   Smoking status: Heavy Tobacco Smoker    Packs/day: 1.00    Years: 10.00    Pack years: 10.00    Types: Cigarettes   Smokeless tobacco: Never Used  Substance Use Topics   Alcohol use: Not on file    Comment: 1/5 hennessey per weekend   Drug use: Yes    Frequency: 20.0 times per week    Types: Marijuana     Allergies   Ibuprofen   Review of Systems Review of Systems  Constitutional: Negative for fever.  HENT: Negative for congestion.   Respiratory: Negative for cough.   Cardiovascular: Negative for chest pain.  Gastrointestinal: Negative for abdominal pain.  Musculoskeletal: Positive for gait problem.  Skin: Negative for color change.  Neurological: Negative for weakness.  Hematological: Negative for adenopathy.     Physical Exam Triage Vital Signs ED Triage Vitals  Enc Vitals Group     BP 09/04/19 1312 137/70     Pulse Rate 09/04/19 1312 74     Resp 09/04/19 1312 18     Temp 09/04/19 1312 98.5 F (36.9 C)  Temp Source 09/04/19 1312 Oral     SpO2 09/04/19 1312 100 %     Weight --      Height --      Head Circumference --      Peak Flow --      Pain Score 09/04/19 1314 8     Pain Loc --      Pain Edu? --      Excl. in GC? --    No data found.  Updated Vital Signs BP 137/70 (BP Location: Right Arm)    Pulse 74    Temp 98.5 F (36.9 C) (Oral)    Resp 18    SpO2 100%   Visual Acuity Right Eye Distance:   Left Eye Distance:   Bilateral Distance:    Right Eye Near:   Left Eye Near:    Bilateral Near:     Physical Exam Gen: NAD, alert, cooperative with exam, well-appearing ENT: normal lips, normal nasal mucosa,  Eye: normal EOM, normal conjunctiva and lids CV:  no edema, +2 pedal pulses   Resp: no accessory muscle use, non-labored,   Skin: no rashes, no areas of induration   Neuro: normal tone, normal sensation to touch Psych:  normal insight, alert and oriented MSK:  Right knee: No obvious effusion. Tenderness to palpation over the medial joint line. Normal range of motion. No instability. Negative McMurray's test. Unable to tolerate Thessaly test. No pain with patellar grind. Neurovascularly intact  UC Treatments / Results  Labs (all labs ordered are listed, but only abnormal results are displayed) Labs Reviewed - No data to display  EKG   Radiology No results found.  Procedures Procedures (including critical care time)  Medications Ordered in UC Medications - No data to display  Initial Impression / Assessment and Plan / UC Course  I have reviewed the triage vital signs and the nursing notes.  Pertinent labs & imaging results that were available during my care of the patient were reviewed by me and considered in my medical decision making (see chart for details).     Michael Golden is a 28 year old male that is presenting with right knee pain.  Seems secondary to patellofemoral syndrome.  Ultrasound was not revealing for any structural changes.  No effusion noted on ultrasound.  Counseled on home exercise therapy and supportive care.  Will provide prednisone.  Given indications to follow-up.  He was provided with a patellar tracking brace.  Final Clinical Impressions(s) / UC Diagnoses   Final diagnoses:  Patellofemoral pain syndrome of right knee     Discharge Instructions     Please try ice  Please try the brace Please try the medicine  Please follow up in 2 weeks if your symptoms have not improved    ED Prescriptions    Medication Sig Dispense Auth. Provider   predniSONE (DELTASONE) 5 MG tablet Take 6 pills for first day, 5 pills second day, 4 pills third day, 3 pills fourth day, 2 pills the fifth day, and 1 pill sixth day. 21 tablet Myra Rude, MD     PDMP not reviewed this encounter.   Myra Rude, MD 09/04/19  [id number]

## 2019-09-06 ENCOUNTER — Telehealth: Payer: Self-pay | Admitting: *Deleted

## 2019-09-06 DIAGNOSIS — Z20828 Contact with and (suspected) exposure to other viral communicable diseases: Secondary | ICD-10-CM

## 2019-09-06 DIAGNOSIS — Z20822 Contact with and (suspected) exposure to covid-19: Secondary | ICD-10-CM

## 2019-09-06 NOTE — Telephone Encounter (Signed)
Noted agree with plan of care 

## 2019-09-06 NOTE — Telephone Encounter (Signed)
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 Spoke with pt over phone and he sts he was at his cousin's house on 11/18 and found out on Saturday morning 11/21 that cousin's spouse tested positive for covid. Pt denies any current sx, feels well. Pt has already discussed 14 day quarantine with HR manager. Confirmed with pt his current return to work date as 09/16/19. Pt advised to call clinic with any concerns, questions or if sx start. Reviewed possible sx as well as sx that would require same day care/ER visit.   Scheduled for testing at [facility] 09/08/19. Updated drive thru testing times given as 10a-3p. Pt has no further questions/concerns.

## 2019-09-08 ENCOUNTER — Other Ambulatory Visit: Payer: Self-pay

## 2019-09-08 DIAGNOSIS — Z20822 Contact with and (suspected) exposure to covid-19: Secondary | ICD-10-CM

## 2019-09-09 LAB — NOVEL CORONAVIRUS, NAA: SARS-CoV-2, NAA: NOT DETECTED

## 2019-09-10 ENCOUNTER — Encounter: Payer: Self-pay | Admitting: *Deleted

## 2019-09-10 NOTE — Telephone Encounter (Signed)
Negative covid 19 PCR test continue plan of care as previously discussed.  No answer at patient contact number and voicemail not set up.  [employer] Tim notified negative test.

## 2019-09-11 NOTE — Telephone Encounter (Signed)
Chart review epic completed patient was seen at urgent care 09/04/2019 and given knee brace and prednisone 60mg  taper oral and told to follow up in 2 weeks.

## 2019-09-11 NOTE — Telephone Encounter (Signed)
Attempted to reach patient via telephone no answer and voicemail not set up.

## 2019-09-12 NOTE — Telephone Encounter (Signed)
Noted patient has appt scheduled 13 Sep 2019 at 1400 with [facility] provider.  Not able to reach patient to discuss negative covid test results either.

## 2019-09-13 ENCOUNTER — Other Ambulatory Visit: Payer: Self-pay

## 2019-09-13 ENCOUNTER — Ambulatory Visit (INDEPENDENT_AMBULATORY_CARE_PROVIDER_SITE_OTHER): Payer: Self-pay | Admitting: Family Medicine

## 2019-09-13 DIAGNOSIS — M76891 Other specified enthesopathies of right lower limb, excluding foot: Secondary | ICD-10-CM | POA: Insufficient documentation

## 2019-09-13 DIAGNOSIS — M222X1 Patellofemoral disorders, right knee: Secondary | ICD-10-CM | POA: Insufficient documentation

## 2019-09-13 NOTE — Patient Instructions (Signed)
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 Avoid painful activities when possible (often deep squats, lunges bother this). Cross train with swimming, cycling with low resistance, elliptical if needed. Straight leg raise, hip side raises, straight leg raises with foot turned outwards 3 sets of 10 once a day. Add ankle weight if these become too easy. Consider formal physical therapy - call us when insurance goes through and we can order this. Arch supports may be helpful as well Avoid flat shoes, barefoot walking as much as possible. Icing 15 minutes at a time 3-4 times a day as needed. Voltaren gel up to 4 times a day topically for pain and inflammation. Tylenol as needed for pain. Follow up with me in 6 weeks.

## 2019-09-13 NOTE — Telephone Encounter (Signed)
Recommendations from patient's sports medicine appt today with Dr Karlton Lemon: You have patellofemoral syndrome and distal hamstring tendinopathy. Avoid painful activities when possible (often deep squats, lunges bother this). Cross train with swimming, cycling with low resistance, elliptical if needed. Straight leg raise, hip side raises, straight leg raises with foot turned outwards 3 sets of 10 once a day. Add ankle weight if these become too easy. Consider formal physical therapy - call us when insurance goes through and we can order this. Arch supports may be helpful as well Avoid flat shoes, barefoot walking as much as possible. Icing 15 minutes at a time 3-4 times a day as needed. Voltaren gel up to 4 times a day topically for pain and inflammation. Tylenol as needed for pain. Follow up with me in 6 weeks."

## 2019-09-13 NOTE — Assessment & Plan Note (Signed)
TTP over distal hamstring insertion with reduced knee flexion strength on the right.  Reassuringly no evidence of tear on ultrasound.  Recommended conservative therapies as discussed above.

## 2019-09-13 NOTE — Progress Notes (Signed)
Daryn Hicks - 28 y.o. male MRN [id number]  Date of birth: 02-23-1991  SUBJECTIVE:   CC: Right knee pain  HPI: Jess is a healthy 28 year old gentleman presenting for evaluation of the following:  Right knee pain: Seen for this complaint on 09/04/2019 at [facility] urgent care, thought to be secondary to patellofemoral syndrome.  Given prednisone taper and a patellar tracking brace with a home exercise regimen after unremarkable knee ultrasound.  He presents today for follow-up from this visit, has noticed some improvement, but continues to have anterior and posterior/lateral knee pain.  He has been wearing his knee brace 24 hours a day except for showering and doing the home exercises.  Finish the prednisone taper, cannot take NSAIDs due to previous history of severe acute kidney injury with this.  Of note, was also seen by his occupational nurse practitioner on 11/19 and diagnosed with bilateral patellar tendinitis.  Pain is worse with going up and down stairs.  He is on his feet 8 hours a day at work, constantly going up and down ladders, working towards [employer] for current complaint.  Denies any locking, giving out sensation, numbness/tingling, popping/clicking, hip/back pain.  ROS: No unexpected weight loss, fever, chills, swelling, instability, muscle pain, numbness/tingling, redness, otherwise see HPI   Past medical, surgical, family, and social history reviewed.  Medications reviewed.  PHYSICAL EXAM:  VS: BP:100/60  HR: bpm  TEMP: ( )  RESP:   HT:5\' 7"  (170.2 cm)   WT:150 lb (68 kg)  BMI:23.49 PHYSICAL EXAM: Gen: NAD, alert, cooperative with exam, well-appearing Resp: non-labored Skin: no rashes, normal turgor  Neuro: no gross deficits.  Psych:  alert and oriented  Right knee: - Inspection: no gross deformity. No swelling/effusion, erythema or bruising. Skin intact. - Palpation: TTP along post patellar facets and medial joint line, along distal lateral hamstring tendon.  - ROM: full active ROM with flexion and extension in knee and hip - Strength: 5/5 strength with knee flexion, hip flexion/extension.  4/5 muscle strength with hip abduction and adduction.  3-4/5 muscle strength with knee extension. - Neuro/vasc: NV intact - Special Tests: - LIGAMENTS: negative anterior and posterior drawer, negative Lachman's, no MCL or LCL laxity  -- MENISCUS: negative McMurray's, negative Thessaly  -- PF JOINT: Positive J sign.  nml patellar mobility bilaterally.  negative patellar grind, negative patellar apprehension  Left knee:  - Inspection: no gross deformity. No swelling/effusion, erythema or bruising. Skin intact.  No instability. - Palpation: no TTP - ROM: full active ROM with flexion and extension in knee and hip - Strength: 5/5 strength, with exception of 4/5 strength in hip adduction and abduction - Neuro/vasc: NV intact  Hips: normal ROM  Limited ultrasound of right knee revealing  Patellar and quadriceps tendons were well visualized with no abnormalities.  Hamstring tendons were well visualized and intact without any surrounding edema or notable tear. No effusion.  ASSESSMENT & PLAN:   Patellofemoral pain syndrome of right knee Associated with poor VMO and hip abductor strength and stability on exam, suspect this has been underlying for quite some time.  Fortunately he has already been provided with a patella stabilizing brace and s/p prednisone taper, recommended continuing with brace during activity.  May use Tylenol/topical Voltaren gel with ice as needed.  Provided him with additional hip and knee strengthening exercises for rehabilitation.  Work note with minor restrictions given.  He is currently working towards [employer], will place referral for formal physical therapy if he qualifies.  Hamstring tendinitis of right thigh TTP over distal hamstring insertion with reduced knee flexion strength on the right.  Reassuringly no evidence of tear on  ultrasound.  Recommended conservative therapies as discussed above.   Follow-up in 6 weeks or sooner if needed.  Patient to call to have formal physical therapy referral placed.  Allayne Stack, DO  Family Medicine PGY-2

## 2019-09-13 NOTE — Assessment & Plan Note (Signed)
Associated with poor VMO and hip abductor strength and stability on exam, suspect this is been underlying for quite some time.  Fortunately he has already been provided with a patella stabilizing brace and s/p prednisone taper, recommended continuing with brace during activity.  May use Tylenol/topical Voltaren gel with ice as needed.  Provided him with additional hip strengthening exercises for rehabilitation.  Work note with minor restrictions given.  He is currently working towards [employer], will place referral for formal physical therapy if he qualifies.

## 2019-09-14 ENCOUNTER — Encounter: Payer: Self-pay | Admitting: Family Medicine

## 2019-09-14 NOTE — Telephone Encounter (Signed)
noted 

## 2019-09-14 NOTE — Telephone Encounter (Signed)
Discussed case with Tim HR manager as pt was seen outside of [facility] clinic without alerting [place] clinic. He reports that benefits/WC manager Kenney Houseman has submitted for claim number and will f/u with [facility] insurance for backdated and future coverage r/t injury. Pt will likely need new appt/evaluation prior to 6 week recommended f/u. Will schedule with [facility] when instructed by HR.

## 2019-09-17 ENCOUNTER — Encounter: Payer: Self-pay | Admitting: *Deleted

## 2019-09-21 ENCOUNTER — Ambulatory Visit: Payer: Self-pay

## 2019-09-21 ENCOUNTER — Other Ambulatory Visit: Payer: Self-pay

## 2019-09-21 ENCOUNTER — Other Ambulatory Visit: Payer: Self-pay | Admitting: Family Medicine

## 2019-09-21 DIAGNOSIS — M25561 Pain in right knee: Secondary | ICD-10-CM

## 2019-09-30 NOTE — Telephone Encounter (Signed)
Noted patient employment terminated at [employer] due to positive UDS at [facility]  Patient no longer eligible for care at [facility] clinic

## 2019-09-30 NOTE — Telephone Encounter (Signed)
Pt was seen at [facility] on 09/21/19. At that time he was referred to Ortho. RN was notified on 09/27/19 by [facility] that [employer] ins. had denied pt's claim. RN also received verbal report by phone per [facility] clinic policy of pt's final, [employer]O-verified UDS that was collected on 09/21/19. Pt is no longer employed with [employer] and further f/u by clinic will cease.

## 2019-10-25 ENCOUNTER — Ambulatory Visit (INDEPENDENT_AMBULATORY_CARE_PROVIDER_SITE_OTHER): Payer: Self-pay | Admitting: Family Medicine

## 2019-10-25 ENCOUNTER — Other Ambulatory Visit: Payer: Self-pay

## 2019-10-25 VITALS — BP 104/74 | Ht 66.5 in | Wt 150.0 lb

## 2019-10-25 DIAGNOSIS — M222X1 Patellofemoral disorders, right knee: Secondary | ICD-10-CM

## 2019-10-25 NOTE — Patient Instructions (Signed)
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 Avoid painful activities when possible (often deep squats, lunges bother this). Cross train with swimming, cycling with low resistance, elliptical if needed. Straight leg raise, hip side raises, straight leg raises with foot turned outwards 3 sets of 10 once a day. Add ankle weight if these become too easy. Consider formal physical therapy if you don't continue to improve. Icing 15 minutes at a time 3-4 times a day as needed. Voltaren gel up to 4 times a day topically for pain and inflammation. Tylenol as needed for pain. Follow up with me as needed if you're doing well.

## 2019-10-26 ENCOUNTER — Encounter: Payer: Self-pay | Admitting: Family Medicine

## 2019-10-26 NOTE — Progress Notes (Signed)
PCP: Patient, No Pcp Per  Subjective:   HPI: Patient is a 29 y.o. male here for right knee pain.  09/13/19: Right knee pain: Seen for this complaint on 09/04/2019 at [facility] health urgent care, thought to be secondary to patellofemoral syndrome.  Given prednisone taper and a patellar tracking brace with a home exercise regimen after unremarkable knee ultrasound.  He presents today for follow-up from this visit, has noticed some improvement, but continues to have anterior and posterior/lateral knee pain.  He has been wearing his knee brace 24 hours a day except for showering and doing the home exercises.  Finish the prednisone taper, cannot take NSAIDs due to previous history of severe acute kidney injury with this.  Of note, was also seen by his occupational nurse practitioner on 11/19 and diagnosed with bilateral patellar tendinitis.  Pain is worse with going up and down stairs.  He is on his feet 8 hours a day at work, constantly going up and down ladders, working towards [employer] for current complaint.  Denies any locking, giving out sensation, numbness/tingling, popping/clicking, hip/back pain.  10/25/19: Patient reports he is improving. Knee pain much better with home exercises, using knee brace. Taking CBD but no other medications at this time. No buckling, giving out, catching, locking.  Past Medical History:  Diagnosis Date  . Migraines   . Renal disorder    Patient states he overused ibuprofen that led to kidney damage    Current Outpatient Medications on File Prior to Visit  Medication Sig Dispense Refill  . HYDROcodone-acetaminophen (NORCO/VICODIN) 5-325 MG tablet Take 1 tablet by mouth every 6 (six) hours as needed. (Patient not taking: Reported on 10/25/2019) 4 tablet 0  . methocarbamol (ROBAXIN) 500 MG tablet Take 1 tablet (500 mg total) by mouth 2 (two) times daily. (Patient not taking: Reported on 10/25/2019) 20 tablet 0  . metoCLOPramide (REGLAN) 10 MG tablet Take 1 tablet  (10 mg total) by mouth every 8 (eight) hours as needed for up to 5 days for nausea. (Patient not taking: Reported on 01/01/2019) 15 tablet 0  . predniSONE (DELTASONE) 5 MG tablet Take 6 pills for first day, 5 pills second day, 4 pills third day, 3 pills fourth day, 2 pills the fifth day, and 1 pill sixth day. (Patient not taking: Reported on 10/25/2019) 21 tablet 0   No current facility-administered medications on file prior to visit.    History reviewed. No pertinent surgical history.  Allergies  Allergen Reactions  . Ibuprofen Other (See Comments)    Patient reports a history of kidney failure when taking ibuprofen    Social History   Socioeconomic History  . Marital status: Single    Spouse name: Not on file  . Number of children: Not on file  . Years of education: Not on file  . Highest education level: Not on file  Occupational History  . Not on file  Tobacco Use  . Smoking status: Heavy Tobacco Smoker    Packs/day: 1.00    Years: 10.00    Pack years: 10.00    Types: Cigarettes  . Smokeless tobacco: Never Used  Substance and Sexual Activity  . Alcohol use: Not on file    Comment: 1/5 hennessey per weekend  . Drug use: Yes    Frequency: 20.0 times per week    Types: Marijuana  . Sexual activity: Not on file  Other Topics Concern  . Not on file  Social History Narrative  . Not on file  Social Determinants of Health   Financial Resource Strain:   . Difficulty of Paying Living Expenses: Not on file  Food Insecurity:   . Worried About Charity fundraiser in the Last Year: Not on file  . Ran Out of Food in the Last Year: Not on file  Transportation Needs:   . Lack of Transportation (Medical): Not on file  . Lack of Transportation (Non-Medical): Not on file  Physical Activity:   . Days of Exercise per Week: Not on file  . Minutes of Exercise per Session: Not on file  Stress:   . Feeling of Stress : Not on file  Social Connections:   . Frequency of Communication  with Friends and Family: Not on file  . Frequency of Social Gatherings with Friends and Family: Not on file  . Attends Religious Services: Not on file  . Active Member of Clubs or Organizations: Not on file  . Attends Archivist Meetings: Not on file  . Marital Status: Not on file  Intimate Partner Violence:   . Fear of Current or Ex-Partner: Not on file  . Emotionally Abused: Not on file  . Physically Abused: Not on file  . Sexually Abused: Not on file    Family History  Problem Relation Age of Onset  . Migraines Mother   . Migraines Father     BP 104/74   Ht 5' 6.5" (1.689 m)   Wt 150 lb (68 kg)   BMI 23.85 kg/m   Review of Systems: See HPI above.     Objective:  Physical Exam:  Gen: NAD, comfortable in exam room  Right knee: No gross deformity, ecchymoses, swelling. No TTP. FROM with normal strength. Negative ant/post drawers. Negative valgus/varus testing. Negative lachmans. Negative mcmurrays, apleys, patellar apprehension. NV intact distally.   Assessment & Plan:  1. Right knee pain - 2/2 patellofemoral syndrome, distal hamstring tendinopathy.  Much improved.  Continue home exercises.  Icing, voltaren gel, tylenol if needed.  F/u prn.  Consider physical therapy if not improving.

## 2020-03-12 IMAGING — DX DG KNEE COMPLETE 4+V*R*
4 series · 4 of 4 positions shown · non-contrast
Comparison: None.

CLINICAL DATA: Right knee pain.

EXAM:
RIGHT KNEE - COMPLETE 4+ VIEW

[knee pa]
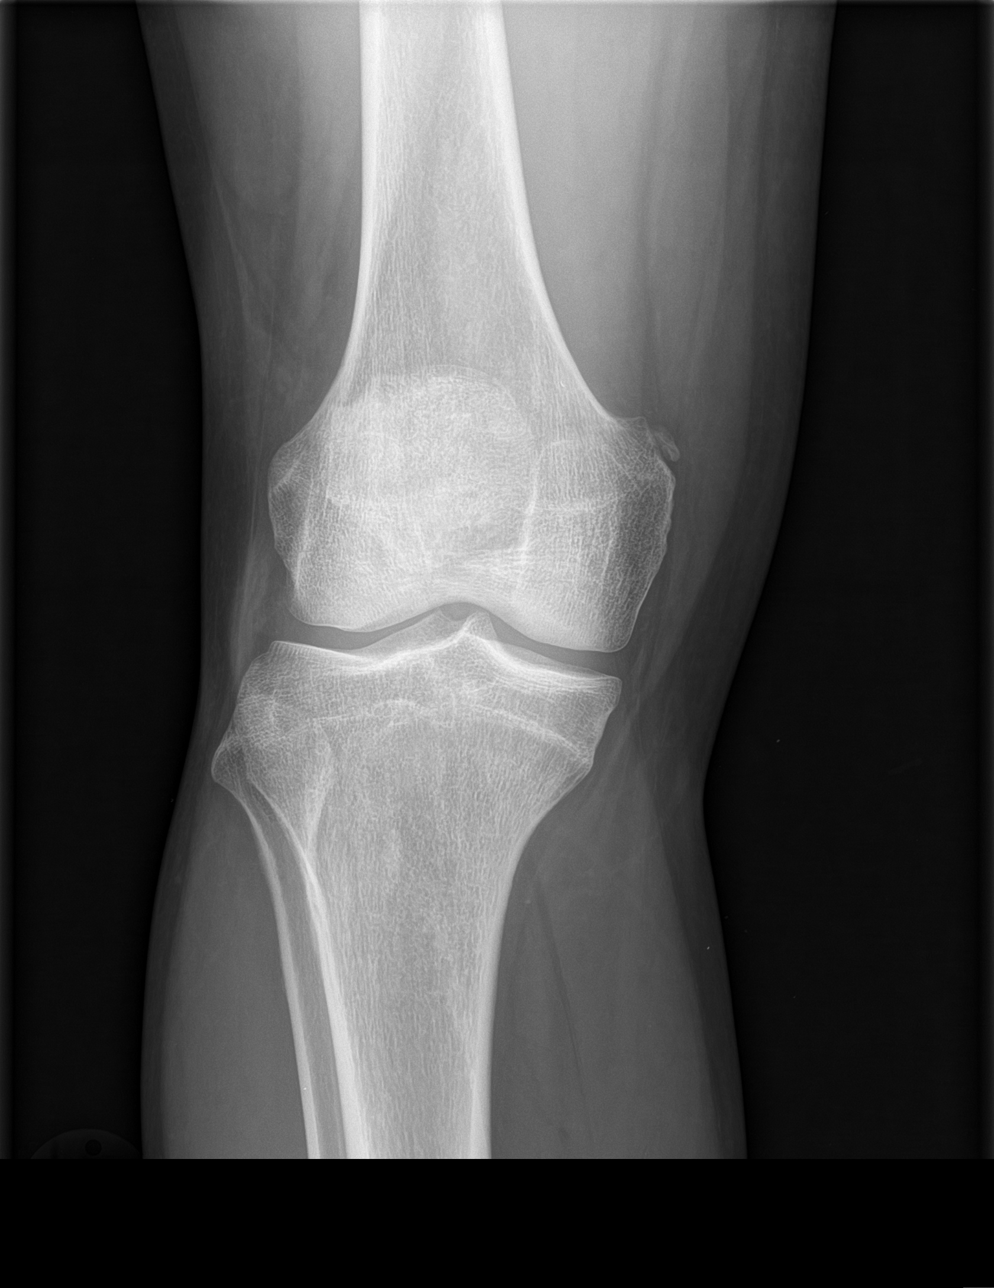

[knee obl (1 of 2)]
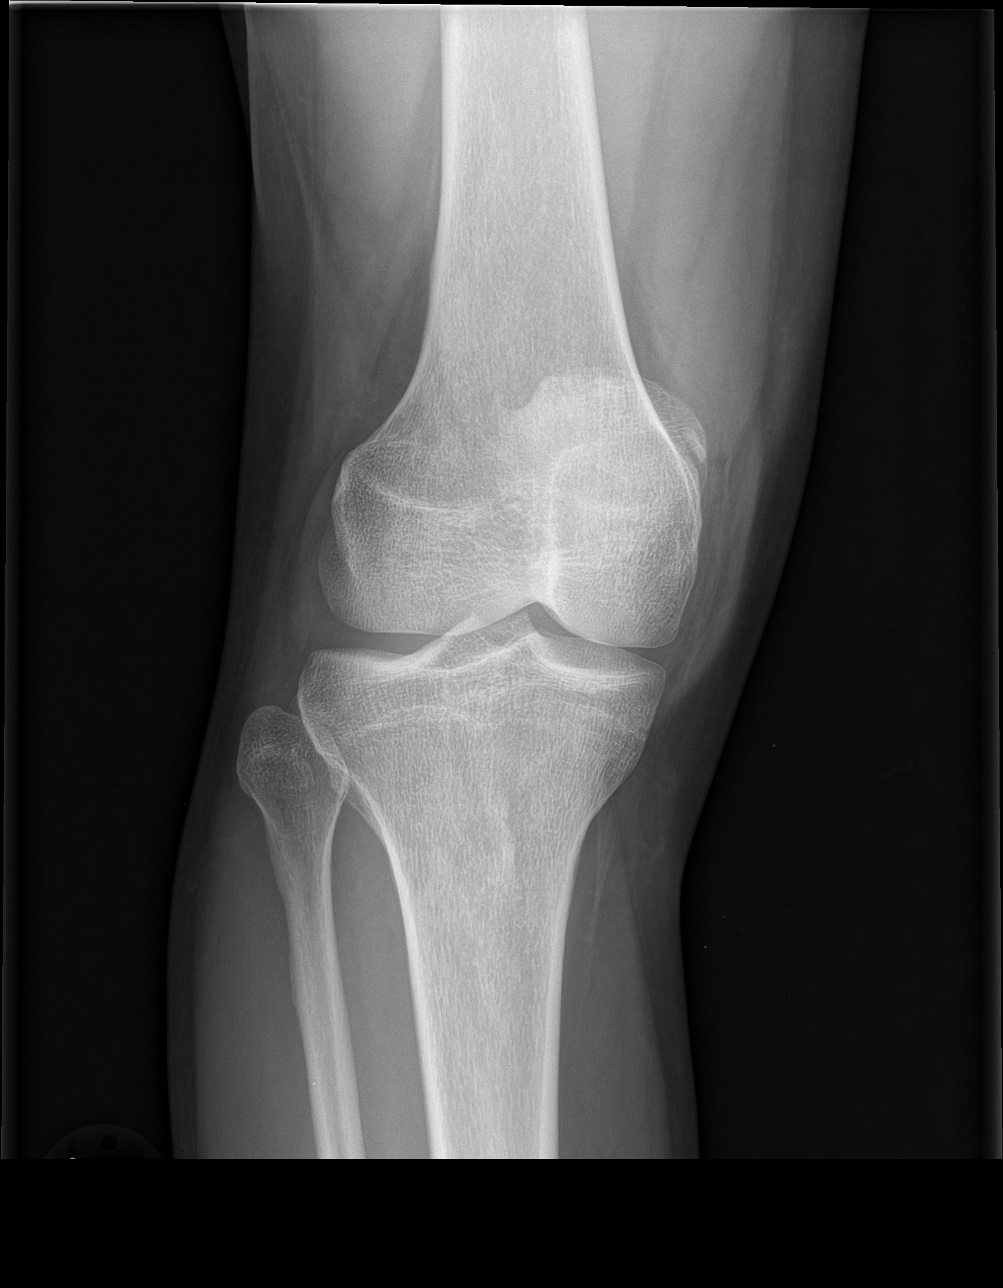

[knee obl (2 of 2)]
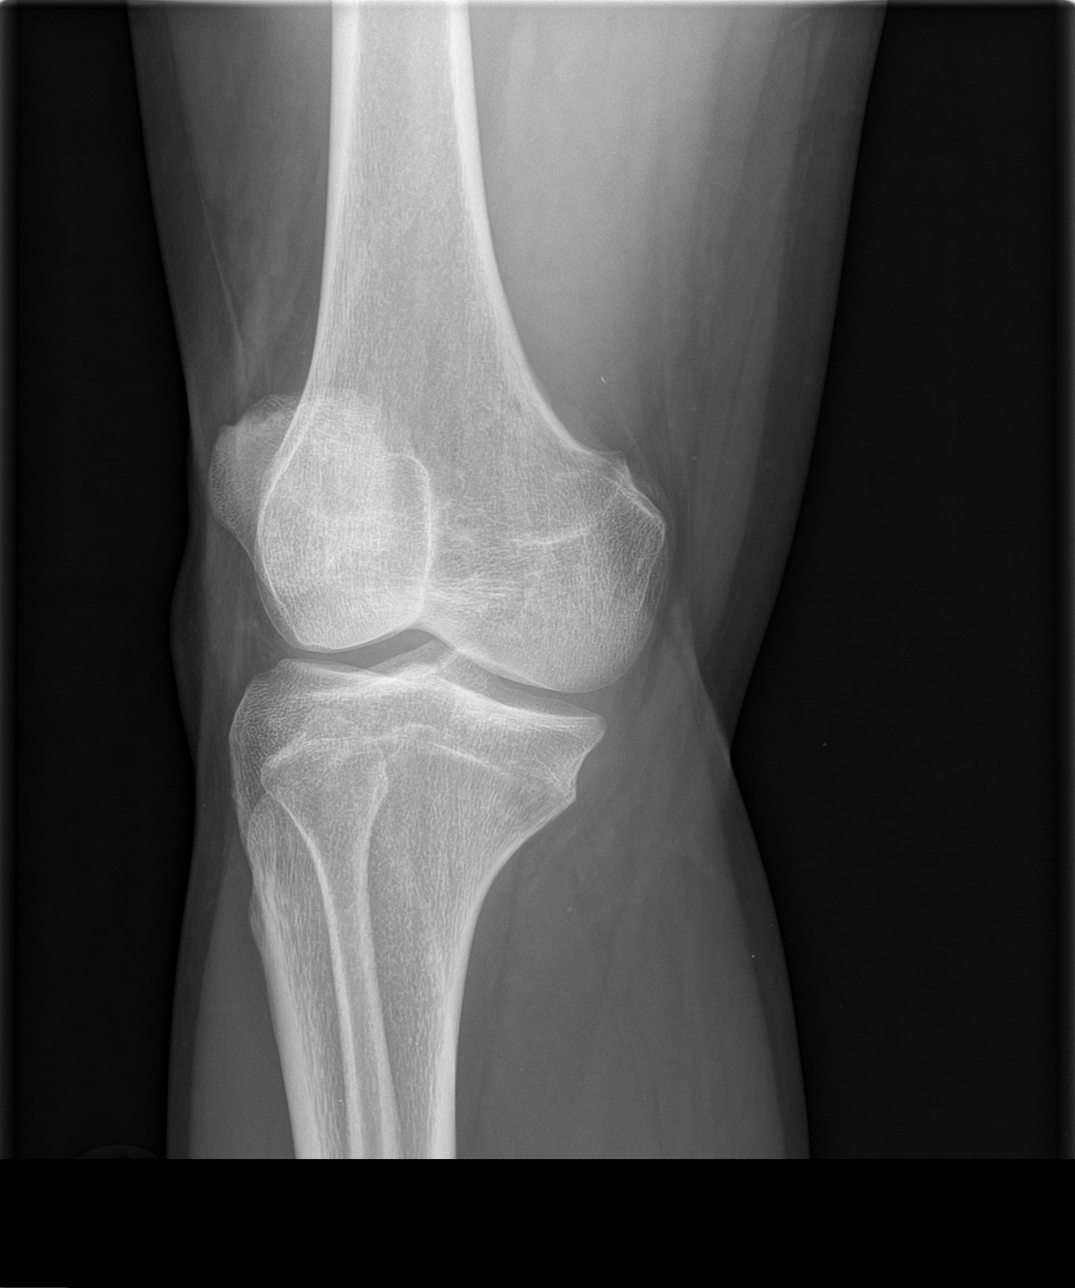

[knee lat]
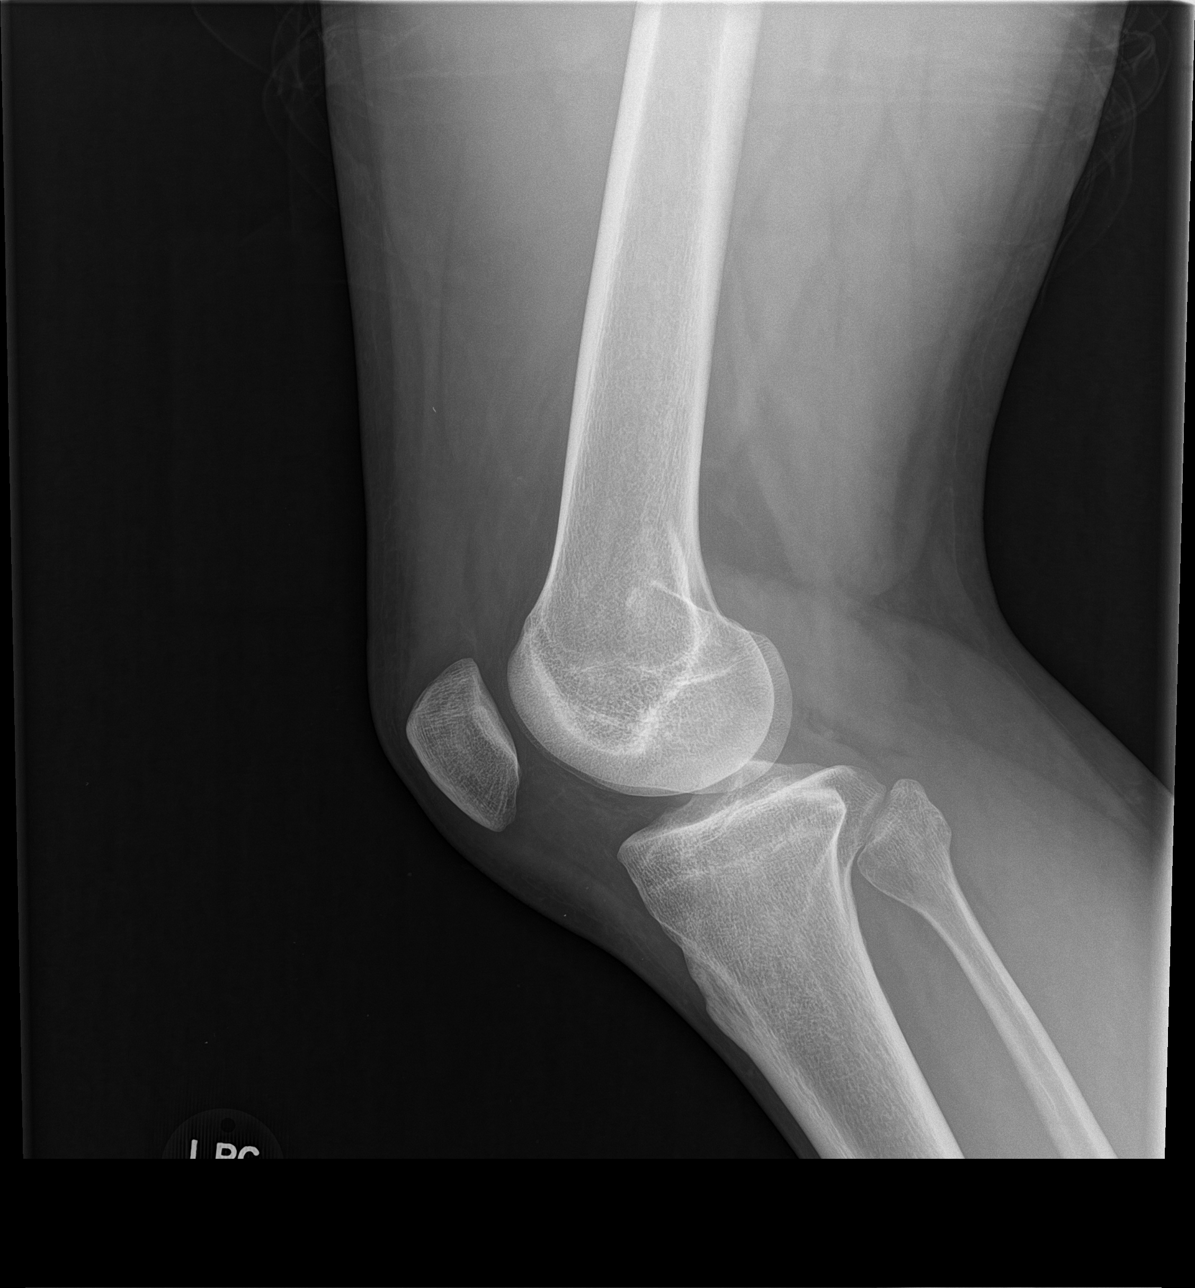

[4 of 4 positions shown; findings below may reference images not displayed]

FINDINGS: No acute fracture or dislocation. No joint effusion. Calcification
adjacent to the distal medial femur is consistent with a previous
medial collateral ligament injury, nonacute.
IMPRESSION: No acute abnormalities are noted. Sequela of previous medial
collateral ligament injury.

## 2021-09-13 ENCOUNTER — Ambulatory Visit
Admission: EM | Admit: 2021-09-13 | Discharge: 2021-09-13 | Disposition: A | Discharge status: Home / Self Care | Payer: Self-pay | Attending: Urgent Care | Admitting: Urgent Care

## 2021-09-13 ENCOUNTER — Encounter: Payer: Self-pay | Admitting: Emergency Medicine

## 2021-09-13 ENCOUNTER — Other Ambulatory Visit: Payer: Self-pay

## 2021-09-13 DIAGNOSIS — F1210 Cannabis abuse, uncomplicated: Secondary | ICD-10-CM

## 2021-09-13 DIAGNOSIS — R112 Nausea with vomiting, unspecified: Secondary | ICD-10-CM

## 2021-09-13 DIAGNOSIS — R109 Unspecified abdominal pain: Secondary | ICD-10-CM

## 2021-09-13 MED ORDER — ONDANSETRON 8 MG PO TBDP: 8.0000 mg | ORAL_TABLET | Freq: Three times a day (TID) | ORAL | 0 refills | Status: DC | PRN

## 2021-09-13 NOTE — ED Triage Notes (Signed)
Vomiting on Thanksgiving.  Started to feel better by the weekend.  Woke up this morning with ABD pain and a metal taste in mouth.  Vomiting started again today.  Upper ABD pain

## 2021-09-13 NOTE — ED Provider Notes (Signed)
[facility]   MRN: [id number] DOB: 08-Oct-1991  Subjective:   Michael Golden is a 30 y.o. male presenting for 1 week history of persistent intermittent nausea and vomiting.  Symptoms initially started at Thanksgiving and then improved over the weekend.  He had a recurrence of symptoms starting yesterday.  Denies heavy alcohol use.  Drink 2 shots in the past week.  Denies runny or stuffy nose, fever, body aches, cough, chest pain, shortness of breath, hematemesis, bloody stools, history of GI issues.  Patient smokes marijuana daily.  Has 2 blunts per day.  No sick contacts to his knowledge.  No current facility-administered medications for this encounter.  Current Outpatient Medications:    HYDROcodone-acetaminophen (NORCO/VICODIN) 5-325 MG tablet, Take 1 tablet by mouth every 6 (six) hours as needed. (Patient not taking: Reported on 10/25/2019), Disp: 4 tablet, Rfl: 0   methocarbamol (ROBAXIN) 500 MG tablet, Take 1 tablet (500 mg total) by mouth 2 (two) times daily. (Patient not taking: Reported on 10/25/2019), Disp: 20 tablet, Rfl: 0   metoCLOPramide (REGLAN) 10 MG tablet, Take 1 tablet (10 mg total) by mouth every 8 (eight) hours as needed for up to 5 days for nausea. (Patient not taking: Reported on 01/01/2019), Disp: 15 tablet, Rfl: 0   predniSONE (DELTASONE) 5 MG tablet, Take 6 pills for first day, 5 pills second day, 4 pills third day, 3 pills fourth day, 2 pills the fifth day, and 1 pill sixth day. (Patient not taking: Reported on 10/25/2019), Disp: 21 tablet, Rfl: 0   Allergies  Allergen Reactions   Ibuprofen Other (See Comments)    Patient reports a history of kidney failure when taking ibuprofen    Past Medical History:  Diagnosis Date   Migraines    Renal disorder    Patient states he overused ibuprofen that led to kidney damage     History reviewed. No pertinent surgical history.  Family History  Problem Relation Age of Onset   Migraines Mother    Migraines  Father     Social History   Tobacco Use   Smoking status: Heavy Smoker    Packs/day: 1.00    Years: 10.00    Pack years: 10.00    Types: Cigarettes   Smokeless tobacco: Never  Vaping Use   Vaping Use: Former  Substance Use Topics   Drug use: Yes    Frequency: 20.0 times per week    Types: Marijuana    ROS   Objective:   Vitals: BP 119/76 (BP Location: Right Arm)   Pulse 96   Temp 98.3 F (36.8 C) (Oral)   Resp 18   SpO2 98%   Physical Exam Constitutional:      General: He is not in acute distress.    Appearance: Normal appearance. He is well-developed and normal weight. He is not ill-appearing, toxic-appearing or diaphoretic.  HENT:     Head: Normocephalic and atraumatic.     Right Ear: External ear normal.     Left Ear: External ear normal.     Nose: Nose normal.     Mouth/Throat:     Mouth: Mucous membranes are moist.     Pharynx: Oropharynx is clear.  Eyes:     General: No scleral icterus.       Right eye: No discharge.        Left eye: No discharge.     Extraocular Movements: Extraocular movements intact.     Pupils: Pupils are equal, round, and reactive to light.  Cardiovascular:     Rate and Rhythm: Normal rate and regular rhythm.     Heart sounds: Normal heart sounds. No murmur heard.   No friction rub. No gallop.  Pulmonary:     Effort: Pulmonary effort is normal. No respiratory distress.     Breath sounds: Normal breath sounds. No stridor. No wheezing, rhonchi or rales.  Abdominal:     General: Bowel sounds are normal. There is no distension.     Palpations: Abdomen is soft. There is no mass.     Tenderness: There is abdominal tenderness (left sided, epigastric). There is no right CVA tenderness, left CVA tenderness, guarding or rebound.  Musculoskeletal:     Cervical back: Normal range of motion.  Neurological:     Mental Status: He is alert and oriented to person, place, and time.  Psychiatric:        Mood and Affect: Mood normal.         Behavior: Behavior normal.        Thought Content: Thought content normal.        Judgment: Judgment normal.    Assessment and Plan :   PDMP not reviewed this encounter.  1. Left sided abdominal pain   2. Marijuana abuse   3. Nausea and vomiting, unspecified vomiting type    Suspect symptoms related to marijuana abuse.  Offered him blood work and referral but declined due to cost burden.  Low suspicion for acute abdomen, diverticulitis given physical exam findings, lack of guarding, no bloody stools, no fever or tachycardia.  Recommend supportive care. Counseled patient on potential for adverse effects with medications prescribed/recommended today, ER and return-to-clinic precautions discussed, patient verbalized understanding.    Wallis Bamberg, [place] 09/13/21 [phone]

## 2021-09-13 NOTE — Discharge Instructions (Addendum)

## 2021-10-25 ENCOUNTER — Other Ambulatory Visit: Payer: Self-pay

## 2021-10-25 ENCOUNTER — Ambulatory Visit
Admission: EM | Admit: 2021-10-25 | Discharge: 2021-10-25 | Disposition: A | Discharge status: Home / Self Care | Payer: Self-pay | Attending: Urgent Care | Admitting: Urgent Care

## 2021-10-25 ENCOUNTER — Encounter: Payer: Self-pay | Admitting: Emergency Medicine

## 2021-10-25 DIAGNOSIS — Z9189 Other specified personal risk factors, not elsewhere classified: Secondary | ICD-10-CM

## 2021-10-25 DIAGNOSIS — R509 Fever, unspecified: Secondary | ICD-10-CM

## 2021-10-25 DIAGNOSIS — B349 Viral infection, unspecified: Secondary | ICD-10-CM

## 2021-10-25 DIAGNOSIS — R52 Pain, unspecified: Secondary | ICD-10-CM

## 2021-10-25 MED ORDER — PROMETHAZINE-DM 6.25-15 MG/5ML PO SYRP: 5.0000 mL | ORAL_SOLUTION | Freq: Every evening | ORAL | 0 refills | Status: DC | PRN

## 2021-10-25 MED ORDER — BENZONATATE 100 MG PO CAPS: 100.0000 mg | ORAL_CAPSULE | Freq: Three times a day (TID) | ORAL | 0 refills | Status: DC | PRN

## 2021-10-25 NOTE — ED Triage Notes (Signed)
Fell weak since Tuesday.  Body aches this morning, fever last dose of tylenol was at 730am.

## 2021-10-25 NOTE — Discharge Instructions (Addendum)

## 2021-10-25 NOTE — ED Provider Notes (Signed)
[facility]   MRN: [id number] DOB: 1991-08-01  Subjective:   Michael Golden is a 31 y.o. male presenting for 2 to 3-day history of acute onset malaise, fatigue, fevers, body aches.  Has also had a mild cough.  Denies any headache, confusion, ear pain, throat pain, chest pain, shortness of breath.  No history of asthma.  Patient does smoke cigarettes and marijuana, smokes about every other day.  No sick contacts to his knowledge.  No current facility-administered medications for this encounter.  Current Outpatient Medications:    HYDROcodone-acetaminophen (NORCO/VICODIN) 5-325 MG tablet, Take 1 tablet by mouth every 6 (six) hours as needed. (Patient not taking: Reported on 10/25/2019), Disp: 4 tablet, Rfl: 0   methocarbamol (ROBAXIN) 500 MG tablet, Take 1 tablet (500 mg total) by mouth 2 (two) times daily. (Patient not taking: Reported on 10/25/2019), Disp: 20 tablet, Rfl: 0   metoCLOPramide (REGLAN) 10 MG tablet, Take 1 tablet (10 mg total) by mouth every 8 (eight) hours as needed for up to 5 days for nausea. (Patient not taking: Reported on 01/01/2019), Disp: 15 tablet, Rfl: 0   ondansetron (ZOFRAN-ODT) 8 MG disintegrating tablet, Take 1 tablet (8 mg total) by mouth every 8 (eight) hours as needed for nausea or vomiting., Disp: 20 tablet, Rfl: 0   predniSONE (DELTASONE) 5 MG tablet, Take 6 pills for first day, 5 pills second day, 4 pills third day, 3 pills fourth day, 2 pills the fifth day, and 1 pill sixth day. (Patient not taking: Reported on 10/25/2019), Disp: 21 tablet, Rfl: 0   Allergies  Allergen Reactions   Ibuprofen Other (See Comments)    Patient reports a history of kidney failure when taking ibuprofen    Past Medical History:  Diagnosis Date   Migraines    Renal disorder    Patient states he overused ibuprofen that led to kidney damage     History reviewed. No pertinent surgical history.  Family History  Problem Relation Age of Onset   Migraines Mother     Migraines Father     Social History   Tobacco Use   Smoking status: Heavy Smoker    Packs/day: 1.00    Years: 10.00    Pack years: 10.00    Types: Cigarettes   Smokeless tobacco: Never  Vaping Use   Vaping Use: Former  Substance Use Topics   Drug use: Yes    Frequency: 20.0 times per week    Types: Marijuana    ROS   Objective:   Vitals: BP (!) 141/75 (BP Location: Right Arm)    Pulse (!) 109    Temp (!) 100.7 F (38.2 C) (Oral)    Resp 18    SpO2 97%   Physical Exam Constitutional:      General: He is not in acute distress.    Appearance: Normal appearance. He is well-developed and normal weight. He is not ill-appearing, toxic-appearing or diaphoretic.  HENT:     Head: Normocephalic and atraumatic.     Right Ear: External ear normal.     Left Ear: External ear normal.     Nose: Nose normal.     Mouth/Throat:     Mouth: Mucous membranes are moist.     Pharynx: No oropharyngeal exudate or posterior oropharyngeal erythema.  Eyes:     General: No scleral icterus.       Right eye: No discharge.        Left eye: No discharge.     Extraocular Movements:  Extraocular movements intact.     Conjunctiva/sclera: Conjunctivae normal.  Cardiovascular:     Rate and Rhythm: Normal rate and regular rhythm.     Heart sounds: Normal heart sounds. No murmur heard.   No friction rub. No gallop.  Pulmonary:     Effort: Pulmonary effort is normal. No respiratory distress.     Breath sounds: Normal breath sounds. No stridor. No wheezing, rhonchi or rales.  Musculoskeletal:     Cervical back: Normal range of motion.  Neurological:     Mental Status: He is alert and oriented to person, place, and time.     Cranial Nerves: No cranial nerve deficit.     Motor: No weakness.     Coordination: Coordination normal.     Gait: Gait normal.  Psychiatric:        Mood and Affect: Mood normal.        Behavior: Behavior normal.        Thought Content: Thought content normal.         Judgment: Judgment normal.    Assessment and Plan :   PDMP not reviewed this encounter.  1. Acute viral syndrome   2. At increased risk of exposure to COVID-19 virus   3. Fever, unspecified   4. Body aches    Patient had taken Tylenol just prior to coming into our clinic.  Unfortunately, he is not able to take NSAIDs due to the kidney injury as it causes him.  Recommended supportive care.  COVID and flu testing pending.  High suspicion for COVID-19 given his symptoms, vital signs, obtained basic metabolic panel and event he continues to feel bad and test positive for COVID-19 patient may be a good candidate for COVID antivirals.  Deferred imaging given clear cardiopulmonary exam, hemodynamically stable vital signs. Counseled patient on potential for adverse effects with medications prescribed/recommended today, ER and return-to-clinic precautions discussed, patient verbalized understanding.    Wallis Bamberg, PA-C 10/25/21 1021

## 2021-10-26 LAB — BASIC METABOLIC PANEL
BUN/Creatinine Ratio: 12 (ref 9–20)
BUN: 12 mg/dL (ref 6–20)
CO2: 17 mmol/L — ABNORMAL LOW (ref 20–29)
Calcium: 9.4 mg/dL (ref 8.7–10.2)
Chloride: 106 mmol/L (ref 96–106)
Creatinine, Ser: 1.03 mg/dL (ref 0.76–1.27)
Glucose: 88 mg/dL (ref 70–99)
Potassium: 4.4 mmol/L (ref 3.5–5.2)
Sodium: 142 mmol/L (ref 134–144)
eGFR: 101 mL/min/{1.73_m2} (ref >59)

## 2021-10-26 LAB — COVID-19, FLU A+B NAA
Influenza A, NAA: NOT DETECTED
Influenza B, NAA: NOT DETECTED
SARS-CoV-2, NAA: DETECTED — AB

## 2021-11-01 ENCOUNTER — Ambulatory Visit
Admission: EM | Admit: 2021-11-01 | Discharge: 2021-11-01 | Disposition: A | Discharge status: Home / Self Care | Payer: Self-pay | Attending: Family Medicine | Admitting: Family Medicine

## 2021-11-01 ENCOUNTER — Encounter: Payer: Self-pay | Admitting: Emergency Medicine

## 2021-11-01 ENCOUNTER — Other Ambulatory Visit: Payer: Self-pay

## 2021-11-01 DIAGNOSIS — Z20822 Contact with and (suspected) exposure to covid-19: Secondary | ICD-10-CM

## 2021-11-01 NOTE — ED Triage Notes (Signed)
States he needs another covid test for work.

## 2021-11-02 LAB — NOVEL CORONAVIRUS, NAA

## 2021-11-02 LAB — SARS-COV-2, NAA 2 DAY TAT

## 2023-01-09 ENCOUNTER — Emergency Department (HOSPITAL_COMMUNITY)
Admission: EM | Admit: 2023-01-09 | Discharge: 2023-01-09 | Disposition: A | Discharge status: Home / Self Care | Payer: Commercial Managed Care - HMO | Attending: Emergency Medicine | Admitting: Emergency Medicine

## 2023-01-09 ENCOUNTER — Emergency Department (HOSPITAL_COMMUNITY): Payer: Commercial Managed Care - HMO

## 2023-01-09 ENCOUNTER — Other Ambulatory Visit: Payer: Self-pay

## 2023-01-09 ENCOUNTER — Encounter (HOSPITAL_COMMUNITY): Payer: Self-pay

## 2023-01-09 DIAGNOSIS — L03213 Periorbital cellulitis: Secondary | ICD-10-CM

## 2023-01-09 DIAGNOSIS — H538 Other visual disturbances: Secondary | ICD-10-CM | POA: Diagnosis present

## 2023-01-09 LAB — CBC WITH DIFFERENTIAL/PLATELET
Abs Immature Granulocytes: 0.01 10*3/uL (ref 0.00–0.07)
Basophils Absolute: 0 10*3/uL (ref 0.0–0.1)
Basophils Relative: 0 %
Eosinophils Absolute: 0.1 10*3/uL (ref 0.0–0.5)
Eosinophils Relative: 2 %
HCT: 46.9 % (ref 39.0–52.0)
Hemoglobin: 15.4 g/dL (ref 13.0–17.0)
Immature Granulocytes: 0 %
Lymphocytes Relative: 25 %
Lymphs Abs: 1.9 10*3/uL (ref 0.7–4.0)
MCH: 27.6 pg (ref 26.0–34.0)
MCHC: 32.8 g/dL (ref 30.0–36.0)
MCV: 84.2 fL (ref 80.0–100.0)
Monocytes Absolute: 0.6 10*3/uL (ref 0.1–1.0)
Monocytes Relative: 9 %
Neutro Abs: 4.7 10*3/uL (ref 1.7–7.7)
Neutrophils Relative %: 64 %
Platelets: 369 10*3/uL (ref 150–400)
RBC: 5.57 MIL/uL (ref 4.22–5.81)
RDW: 13.4 % (ref 11.5–15.5)
WBC: 7.4 10*3/uL (ref 4.0–10.5)
nRBC: 0 % (ref 0.0–0.2)

## 2023-01-09 LAB — CREATININE, SERUM
Creatinine, Ser: 0.79 mg/dL (ref 0.61–1.24)
GFR, Estimated: >60 mL/min (ref >60)

## 2023-01-09 MED ORDER — FLUORESCEIN SODIUM 1 MG OP STRP
1.0000 | ORAL_STRIP | Freq: Once | OPHTHALMIC | Status: AC
  Administered 2023-01-09: 1 via OPHTHALMIC
  Filled 2023-01-09: qty 1

## 2023-01-09 MED ORDER — ERYTHROMYCIN 5 MG/GM OP OINT: TOPICAL_OINTMENT | OPHTHALMIC | 0 refills | Status: DC

## 2023-01-09 MED ORDER — TETRACAINE HCL 0.5 % OP SOLN
1.0000 [drp] | Freq: Once | OPHTHALMIC | Status: AC
  Administered 2023-01-09: 1 [drp] via OPHTHALMIC
  Filled 2023-01-09: qty 4

## 2023-01-09 MED ORDER — SODIUM CHLORIDE (PF) 0.9 % IJ SOLN
INTRAMUSCULAR | Status: AC
  Filled 2023-01-09: qty 50

## 2023-01-09 MED ORDER — AMOXICILLIN-POT CLAVULANATE 875-125 MG PO TABS: 1.0000 | ORAL_TABLET | Freq: Two times a day (BID) | ORAL | 0 refills | Status: DC

## 2023-01-09 MED ORDER — IOHEXOL 300 MG/ML  SOLN
75.0000 mL | Freq: Once | INTRAMUSCULAR | Status: AC | PRN
  Administered 2023-01-09: 75 mL via INTRAVENOUS

## 2023-01-09 NOTE — ED Provider Notes (Signed)
[name] Provider Note   CSN: [id number] Arrival date & time: 01/09/23  1158     History  Chief Complaint  Patient presents with   Eye Drainage    Michael Golden is a 32 y.o. male, no pertinent past medical history, who presents to the ED secondary to left eye swelling, blurring of vision, and pain with vision for the last 2 days.  He states that he noted couple days ago he had some swelling of his eye, and it is progressively gotten worse, and so much that he can hardly open his eye.  He feels like he is having some drainage of it, and it hurts to move his eye and follow objects.  Endorses some photophobia.  Denies any trauma or recent injury to the eye.  Is not diabetic.  Has taken Benadryl drops, and eyedrops without relief.    Home Medications Prior to Admission medications   Medication Sig Start Date End Date Taking? Authorizing Provider  amoxicillin-clavulanate (AUGMENTIN) 875-125 MG tablet Take 1 tablet by mouth every 12 (twelve) hours. 01/09/23  Yes Anastassia Noack L, PA  benzonatate (TESSALON) 100 MG capsule Take 1-2 capsules (100-200 mg total) by mouth 3 (three) times daily as needed for cough. 10/25/21   Jaynee Eagles, PA-C  erythromycin ophthalmic ointment Place a 1/2 inch ribbon of ointment into the lower eyelid. 4x per day for the next 7 days. 01/09/23  Yes Rayyan Burley L, PA  HYDROcodone-acetaminophen (NORCO/VICODIN) 5-325 MG tablet Take 1 tablet by mouth every 6 (six) hours as needed. Patient not taking: Reported on 10/25/2019 01/01/19   [name], [name] Soho, PA-C  methocarbamol (ROBAXIN) 500 MG tablet Take 1 tablet (500 mg total) by mouth 2 (two) times daily. Patient not taking: Reported on 10/25/2019 01/01/19   [name], [name] Soho, PA-C  metoCLOPramide (REGLAN) 10 MG tablet Take 1 tablet (10 mg total) by mouth every 8 (eight) hours as needed for up to 5 days for nausea. Patient not taking: Reported on 01/01/2019 04/05/18 01/01/28   [name], MD  ondansetron (ZOFRAN-ODT) 8 MG disintegrating tablet Take 1 tablet (8 mg total) by mouth every 8 (eight) hours as needed for nausea or vomiting. 09/13/21   Jaynee Eagles, PA-C  predniSONE (DELTASONE) 5 MG tablet Take 6 pills for first day, 5 pills second day, 4 pills third day, 3 pills fourth day, 2 pills the fifth day, and 1 pill sixth day. Patient not taking: Reported on 10/25/2019 09/04/19   [name], MD  promethazine-dextromethorphan (PROMETHAZINE-DM) 6.25-15 MG/5ML syrup Take 5 mLs by mouth at bedtime as needed for cough. 10/25/21   Jaynee Eagles, PA-C      Allergies    Ibuprofen    Review of Systems   Review of Systems  Constitutional:  Negative for fever.  HENT:  Positive for ear discharge and ear pain.     Physical Exam Updated Vital Signs BP (!) 139/91 (BP Location: Left Arm)   Pulse 99   Temp 98.9 F (37.2 C) (Oral)   Resp 16   Ht 5' 6.5" (1.689 m)   Wt 68 kg   SpO2 100%   BMI 23.85 kg/m  Physical Exam Vitals and nursing note reviewed.  Constitutional:      General: He is not in acute distress.    Appearance: He is well-developed.  HENT:     Head: Normocephalic and atraumatic.  Eyes:     General:        Left eye:  No foreign body.     Conjunctiva/sclera:     Left eye: Left conjunctiva is injected.     Comments: Mild edema to the left eye.  Photosensitivity, pain with eye movements.  Extraocular movements intact however.  Mild injected conjunctiva of the left eye.  PERRLA.  Negative fluorescein exam.  Cardiovascular:     Rate and Rhythm: Normal rate and regular rhythm.     Heart sounds: No murmur heard. Pulmonary:     Effort: Pulmonary effort is normal. No respiratory distress.     Breath sounds: Normal breath sounds.  Abdominal:     Palpations: Abdomen is soft.     Tenderness: There is no abdominal tenderness.  Musculoskeletal:        General: No swelling.     Cervical back: Neck supple.  Skin:    General: Skin is warm and  dry.     Capillary Refill: Capillary refill takes less than 2 seconds.  Neurological:     Mental Status: He is alert.  Psychiatric:        Mood and Affect: Mood normal.    ED Results / Procedures / Treatments   Labs (all labs ordered are listed, but only abnormal results are displayed) Labs Reviewed  CBC WITH DIFFERENTIAL/PLATELET  CREATININE, SERUM    EKG None  Radiology CT Orbits W Contrast  Result Date: 01/09/2023 CLINICAL DATA:  Orbital cellulitis suspected. Left eye drainage x3 days with subjective swelling. EXAM: CT ORBITS WITH CONTRAST TECHNIQUE: Multidetector CT images was performed according to the standard protocol following intravenous contrast administration. RADIATION DOSE REDUCTION: This exam was performed according to the departmental dose-optimization program which includes automated exposure control, adjustment of the mA and/or kV according to patient size and/or use of iterative reconstruction technique. CONTRAST:  61mL OMNIPAQUE IOHEXOL 300 MG/ML  SOLN COMPARISON:  None Available. FINDINGS: Orbits: No orbital mass or evidence of inflammation. Normal appearance of the globes, optic nerve-sheath complexes, extraocular muscles, orbital fat and lacrimal glands. Visible paranasal sinuses: Mild mucosal disease throughout. Soft tissues: Unremarkable. Osseous: No fracture or suspicious bone lesion. Limited intracranial: Unremarkable. IMPRESSION: 1. No evidence of orbital cellulitis. 2. Mild mucosal disease throughout the paranasal sinuses. Electronically Signed   By: Emmit Alexanders M.D.   On: 01/09/2023 15:46    Procedures Procedures    Medications Ordered in ED Medications  sodium chloride (PF) 0.9 % injection (has no administration in time range)  fluorescein ophthalmic strip 1 strip (1 strip Left Eye Given 01/09/23 1254)  tetracaine (PONTOCAINE) 0.5 % ophthalmic solution 1 drop (1 drop Left Eye Given 01/09/23 1254)  iohexol (OMNIPAQUE) 300 MG/ML solution 75 mL (75 mLs  Intravenous Contrast Given 01/09/23 1514)    ED Course/ Medical Decision Making/ A&P                             Medical Decision Making Patient is a 32 year old male, here with pain with extraocular movements, eye discharge, and swelling of the left eye.  We will obtain CT orbits to rule out any orbital cellulitis, as well as CBC.  Amount and/or Complexity of Data Reviewed Labs: ordered.    Details: Unremarkable Radiology: ordered.    Details: CT unremarkable Discussion of management or test interpretation with external provider(s): Discussed with patient, CT unremarkable given his swelling, pain, we will start him on Augmentin, and also erythromycin ointment for possible bacterial conjunctivitis.  He has a negative fluorescein exam.  Extraocular movements  intact.  Return precautions and follow-up with eye doctor, provided with eye doctor.  Risk Prescription drug management.    Final Clinical Impression(s) / ED Diagnoses Final diagnoses:  Preseptal cellulitis of left eye    Rx / DC Orders ED Discharge Orders          Ordered    amoxicillin-clavulanate (AUGMENTIN) 875-125 MG tablet  Every 12 hours        01/09/23 1552    erythromycin ophthalmic ointment        01/09/23 1557              Arlys Scatena Carlean Jews, PA 01/09/23 1608    Lacretia Leigh, MD 01/13/23 [phone]

## 2023-01-09 NOTE — Discharge Instructions (Addendum)
Please follow-up with your primary care doctor, return to the ER if you have worsening swelling pain to the eye.  You should also follow-up with an eye doctor.  If you have any loss of vision, worsening pain, swelling redness please return to the ER.

## 2023-01-09 NOTE — ED Triage Notes (Signed)
Patient has had left eye drainage for 3 days and now feels like it is swollen. Took benadryl and eye drops yesterday. No change in vision.

## 2023-11-25 ENCOUNTER — Encounter (HOSPITAL_COMMUNITY): Payer: Self-pay

## 2023-11-25 ENCOUNTER — Ambulatory Visit (HOSPITAL_COMMUNITY)
Admission: EM | Admit: 2023-11-25 | Discharge: 2023-11-25 | Disposition: A | Discharge status: Home / Self Care | Payer: Self-pay | Attending: Family Medicine | Admitting: Family Medicine

## 2023-11-25 DIAGNOSIS — J111 Influenza due to unidentified influenza virus with other respiratory manifestations: Secondary | ICD-10-CM

## 2023-11-25 LAB — POC COVID19/FLU A&B COMBO
Covid Antigen, POC: NEGATIVE
Influenza A Antigen, POC: POSITIVE — AB
Influenza B Antigen, POC: NEGATIVE

## 2023-11-25 MED ORDER — OSELTAMIVIR PHOSPHATE 75 MG PO CAPS: 75.0000 mg | ORAL_CAPSULE | Freq: Two times a day (BID) | ORAL | 0 refills | Status: DC

## 2023-11-25 NOTE — ED Provider Notes (Signed)
[facility]    CSN: [id number] Arrival date & time: 11/25/23  4098      History   Chief Complaint Chief Complaint  Patient presents with   Cough   Fever    HPI Michael Golden is a 33 y.o. male.    Cough Associated symptoms: chills, fever, rhinorrhea and sore throat   Associated symptoms: no shortness of breath   Fever Associated symptoms: chills, congestion, cough, nausea, rhinorrhea and sore throat    Patient is here for cough, fever, headache, body aches, chills since yesterday.  Fever up to 101 yesterday.  Also with nausea.  Decreased appetite.  Sick exposures at work.  Took tylenol yesterday.        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 No pertinent surgical history.     Home Medications    Prior to Admission medications   Not on File    Family History Family History  Problem Relation Age of Onset   Migraines Mother    Migraines Father     Social History Social History   Tobacco Use   Smoking status: Former    Current packs/day: 1.00    Average packs/day: 1 pack/day for 10.0 years (10.0 ttl pk-yrs)    Types: Cigarettes   Smokeless tobacco: Never  Vaping Use   Vaping status: Some Days  Substance Use Topics   Alcohol use: Not Currently    Comment: 1/5 hennessey per weekend   Drug use: Yes    Frequency: 20.0 times per week    Types: Marijuana     Allergies   Ibuprofen   Review of Systems Review of Systems  Constitutional:  Positive for chills and fever.  HENT:  Positive for congestion, rhinorrhea and sore throat.   Respiratory:  Positive for cough. Negative for shortness of breath.   Cardiovascular: Negative.   Gastrointestinal:   Positive for nausea.  Musculoskeletal: Negative.   Skin: Negative.   Psychiatric/Behavioral: Negative.       Physical Exam Triage Vital Signs ED Triage Vitals  Encounter Vitals Group     BP 11/25/23 0844 111/70     Systolic BP Percentile --      Diastolic BP Percentile --      Pulse Rate 11/25/23 0844 94     Resp 11/25/23 0844 18     Temp 11/25/23 0844 99.2 F (37.3 C)     Temp Source 11/25/23 0844 Oral     SpO2 11/25/23 0844 98 %     Weight --      Height --      Head Circumference --      Peak Flow --      Pain Score 11/25/23 0845 8     Pain Loc --      Pain Education --      Exclude from Growth Chart --    No data found.  Updated Vital Signs BP 111/70 (BP Location: Left Arm)   Pulse 94   Temp 99.2 F (37.3 C) (Oral)   Resp 18   SpO2 98%   Visual Acuity Right Eye Distance:   Left Eye Distance:   Bilateral Distance:    Right  Eye Near:   Left Eye Near:    Bilateral Near:     Physical Exam Constitutional:      General: He is not in acute distress.    Appearance: Normal appearance. He is normal weight. He is ill-appearing. He is not toxic-appearing.  HENT:     Nose: Congestion present.     Mouth/Throat:     Mouth: Mucous membranes are moist.     Pharynx: Posterior oropharyngeal erythema present. No oropharyngeal exudate.  Cardiovascular:     Rate and Rhythm: Normal rate and regular rhythm.  Pulmonary:     Effort: Pulmonary effort is normal.     Breath sounds: Normal breath sounds.  Musculoskeletal:     Cervical back: Normal range of motion and neck supple. Tenderness present.  Lymphadenopathy:     Cervical: No cervical adenopathy.  Neurological:     General: No focal deficit present.     Mental Status: He is alert.  Psychiatric:        Mood and Affect: Mood normal.      UC Treatments / Results  Labs (all labs ordered are listed, but only abnormal results are displayed) Labs Reviewed  POC COVID19/FLU A&B COMBO - Abnormal; Notable for the  following components:      Result Value   Influenza A Antigen, POC Positive (*)    All other components within normal limits    EKG   Radiology No results found.  Procedures Procedures (including critical care time)  Medications Ordered in UC Medications - No data to display  Initial Impression / Assessment and Plan / UC Course  I have reviewed the triage vital signs and the nursing notes.  Pertinent labs & imaging results that were available during my care of the patient were reviewed by me and considered in my medical decision making (see chart for details).   Final Clinical Impressions(s) / UC Diagnoses   Final diagnoses:  Influenza with respiratory manifestation     Discharge Instructions      You were diagnosed with influenza today.  I have sent out tamiflu to help with symptoms.  You may use over the counter tylenol/motrin for body aches and fever, as well as over the counter cough/cold medications.  Please get plenty of rest and increase fluids.  I have given you a note for work as well.     ED Prescriptions     Medication Sig Dispense Auth. Provider   oseltamivir (TAMIFLU) 75 MG capsule Take 1 capsule (75 mg total) by mouth every 12 (twelve) hours. 10 capsule Jannifer Franklin, MD      PDMP not reviewed this encounter.   Jannifer Franklin, MD 11/25/23 [phone]

## 2023-11-25 NOTE — ED Triage Notes (Signed)
Pt c/o cough, fever, headache, body aches, and chills since yesterday morning. States took tylenol yesterday morning with no relief.

## 2023-11-25 NOTE — Discharge Instructions (Signed)
You were diagnosed with influenza today.  I have sent out tamiflu to help with symptoms.  You may use over the counter tylenol/motrin for body aches and fever, as well as over the counter cough/cold medications.  Please get plenty of rest and increase fluids.  I have given you a note for work as well.

## 2024-02-26 ENCOUNTER — Other Ambulatory Visit: Payer: Self-pay

## 2024-02-26 ENCOUNTER — Emergency Department (HOSPITAL_COMMUNITY)
Admission: EM | Admit: 2024-02-26 | Discharge: 2024-02-26 | Disposition: A | Discharge status: Home / Self Care | Payer: Self-pay | Attending: Emergency Medicine | Admitting: Emergency Medicine

## 2024-02-26 DIAGNOSIS — H6121 Impacted cerumen, right ear: Secondary | ICD-10-CM | POA: Insufficient documentation

## 2024-02-26 NOTE — Discharge Instructions (Addendum)
 Evaluation today revealed that you had a significant amount of earwax in your right ear.  This is likely causing your symptoms.  If symptoms persist please follow-up with ENT.

## 2024-02-26 NOTE — ED Triage Notes (Signed)
 Patient reports "ringing" sensation at right ear with waxy drainage and muffled hearing at right side , denies ear injury .

## 2024-02-26 NOTE — ED Provider Notes (Signed)
  [facility] Provider Note   CSN: [id number] Arrival date & time: 02/26/24  4098     History  Chief Complaint  Patient presents with   Tinnitus   HPI Michael Golden is a 33 y.o. male presenting for muffled hearing in the right ear.  Started last night with some ear discomfort.  Also noticed some waxy drainage from his right ear as well.  He states he does use an "earbud" in that right ear all day at work.  Denies fever, nasal congestion or cough.  HPI     Home Medications Prior to Admission medications   Medication Sig Start Date End Date Taking? Authorizing Provider  oseltamivir  (TAMIFLU ) 75 MG capsule Take 1 capsule (75 mg total) by mouth every 12 (twelve) hours. 11/25/23   Lesle Ras, MD      Allergies    Ibuprofen    Review of Systems   See HPI  Physical Exam Updated Vital Signs BP (!) 132/93   Pulse 66   Temp (!) 97.4 F (36.3 C)   Resp 16   SpO2 100%  Physical Exam Constitutional:      Appearance: Normal appearance.  HENT:     Head: Normocephalic.     Right Ear: There is impacted cerumen.     Left Ear: Tympanic membrane, ear canal and external ear normal.     Nose: Nose normal.  Eyes:     Conjunctiva/sclera: Conjunctivae normal.  Pulmonary:     Effort: Pulmonary effort is normal.  Neurological:     Mental Status: He is alert.  Psychiatric:        Mood and Affect: Mood normal.     ED Results / Procedures / Treatments   Labs (all labs ordered are listed, but only abnormal results are displayed) Labs Reviewed - No data to display  EKG None  Radiology No results found.  Procedures Procedures    Medications Ordered in ED Medications - No data to display  ED Course/ Medical Decision Making/ A&P                                 Medical Decision Making  33 year old well-appearing male presenting for right ear discomfort and hearing loss.  Exam notable for cerumen impaction in the right ear.  Irrigated the right ear and used a curette to relieve some cerumen burden.  On reassessment looked improved but still significant cerumen impaction.  Advised if his symptoms persisted they should follow-up with ENT.  Discharged good condition.        Final Clinical Impression(s) / ED Diagnoses Final diagnoses:  Impacted cerumen of right ear    Rx / DC Orders ED Discharge Orders     None         Janalee Mcmurray, PA-C 02/26/24 0804    Tegeler, Marine Sia, MD 02/26/24 [phone]

## 2024-09-15 ENCOUNTER — Ambulatory Visit
Admission: RE | Admit: 2024-09-15 | Discharge: 2024-09-15 | Disposition: A | Discharge status: Home / Self Care | Payer: Self-pay | Attending: Nurse Practitioner | Admitting: Nurse Practitioner

## 2024-09-15 ENCOUNTER — Other Ambulatory Visit: Payer: Self-pay

## 2024-09-15 VITALS — BP 100/73 | HR 78 | Temp 99.2°F | Resp 17 | Ht 67.0 in

## 2024-09-15 DIAGNOSIS — H1032 Unspecified acute conjunctivitis, left eye: Secondary | ICD-10-CM

## 2024-09-15 MED ORDER — NAPHAZOLINE-PHENIRAMINE 0.025-0.3 % OP SOLN: 1.0000 [drp] | Freq: Four times a day (QID) | OPHTHALMIC | 0 refills | Status: AC | PRN

## 2024-09-15 MED ORDER — POLYMYXIN B-TRIMETHOPRIM 10000-0.1 UNIT/ML-% OP SOLN: 1.0000 [drp] | OPHTHALMIC | 0 refills | Status: AC

## 2024-09-15 NOTE — ED Triage Notes (Signed)
 Pt presents with c/o left eye problem x 3 days. States he woke up this morning and the left eye was itching/crusted shut. Currently rates overall pain a 7/10. Feeling pressure in the eye, does endorse runny drainage. Took an OTC allergy pill yesterday (generic brand) with no improvement/relief. Children have been sick recently with different symptoms.

## 2024-09-15 NOTE — Discharge Instructions (Addendum)
 You were seen today for eye symptoms including itching, discharge, and morning crusting, which are consistent with conjunctivitis ("pink eye"). Because viral and bacterial conjunctivitis can look very similar, and your symptoms include thicker yellow discharge, treatment was started with Polytrim eye drops. Use the drops exactly as prescribed and complete the full course, even if your symptoms begin to improve sooner. You may also use the prescribed Naphcon-A drops as needed to soothe dryness, irritation, or burning.  To help prevent spreading infection or reinfection, wash your hands before and after touching your eyes, avoid rubbing the eyes, do not share towels, makeup, or eye products, and discard any eye makeup used before symptoms began. Avoid contact lens wear until symptoms have fully resolved and any prescribed treatment is completed. You can gently clean crusting from the eyelids in the morning using a clean, warm, damp cloth.  Follow up with your primary care provider or an eye specialist if your symptoms have not improved within 3-5 days of starting treatment, if redness or discharge worsens, or if symptoms return after completing medication. Seek emergency care immediately if you develop eye pain, sensitivity to light, blurred or decreased vision, rapidly worsening swelling or redness around the eye, difficulty opening the eye, or any sudden changes in vision, as these can indicate a more serious eye condition requiring urgent evaluation.

## 2024-09-15 NOTE — ED Provider Notes (Signed)
 [facility]    CSN: [id number] Arrival date & time: 09/15/24  1112      History   Chief Complaint Chief Complaint  Patient presents with   Eye Problem    My eye won't stop running not sure if it's pink eye or my sinuses - Entered by patient    HPI Michael Golden is a 33 y.o. male.   Discussed the use of AI scribe software for clinical note transcription with the patient, who gave verbal consent to proceed.   The patient presents with a three-day history of left eye symptoms including drainage, tearing, itching, and irritation. This morning, the patient awoke with crusting around the affected eye accompanied by mild swelling and noted yellowish discharge. The patient reports mildly impaired vision described as blurriness that occurs when the eye becomes watery; vision otherwise improves after wiping the drainage. During the first two days of symptoms, the patient experienced light sensitivity, particularly when exposed to sunlight reflecting off surfaces, though this has since resolved. The patient currently denies eye pain.  The patient denies associated fever, headache, dizziness, cough, congestion, or other upper respiratory symptoms. An allergy medication was taken yesterday without any improvement. The right eye remains unaffected. The patient does not wear contact lenses or corrective eyewear and denies any ocular trauma, rash, or known eye injury.  The following sections of the patient's history were reviewed and updated as appropriate: allergies, current medications, past family history, past medical history, past social history, past surgical history, and problem list.       Past Medical History:  Diagnosis Date   Migraines    Renal disorder    Patient states he overused ibuprofen that led to kidney damage    Patient Active Problem List   Diagnosis Date Noted   Patellofemoral pain syndrome of right knee 09/13/2019   Hamstring tendinitis of right thigh  09/13/2019    History reviewed. No pertinent surgical history.     Home Medications    Prior to Admission medications   Medication Sig Start Date End Date Taking? Authorizing Provider  naphazoline-pheniramine (NAPHCON-A) 0.025-0.3 % ophthalmic solution Place 1 drop into the left eye 4 (four) times daily as needed for eye irritation. 09/15/24  Yes Iola Lukes, FNP  trimethoprim-polymyxin b (POLYTRIM) ophthalmic solution Place 1 drop into the left eye every 3 (three) hours while awake. 09/15/24  Yes Iola Lukes, FNP    Family History Family History  Problem Relation Age of Onset   Migraines Mother    Migraines Father     Social History Social History   Tobacco Use   Smoking status: Former    Current packs/day: 1.00    Average packs/day: 1 pack/day for 10.0 years (10.0 ttl pk-yrs)    Types: Cigarettes   Smokeless tobacco: Never  Vaping Use   Vaping status: Some Days  Substance Use Topics   Alcohol use: Not Currently    Comment: 1/5 hennessey per weekend   Drug use: Yes    Frequency: 20.0 times per week    Types: Marijuana     Allergies   Ibuprofen   Review of Systems Review of Systems  Constitutional:  Negative for fever.  HENT: Negative.    Eyes:  Positive for photophobia (sunlight caused pain the first 2 days of sx but no longer), pain (earlier but none currently), discharge (watery), itching and visual disturbance (only when eye is watering). Negative for redness.  Skin:  Negative for rash.  Neurological:  Negative for  dizziness and headaches.  All other systems reviewed and are negative.    Physical Exam Triage Vital Signs ED Triage Vitals  Encounter Vitals Group     BP 09/15/24 1127 100/73     Girls Systolic BP Percentile --      Girls Diastolic BP Percentile --      Boys Systolic BP Percentile --      Boys Diastolic BP Percentile --      Pulse Rate 09/15/24 1127 78     Resp 09/15/24 1127 17     Temp 09/15/24 1127 99.2 F (37.3 C)      Temp Source 09/15/24 1127 Oral     SpO2 09/15/24 1127 97 %     Weight --      Height 09/15/24 1125 5' 7 (1.702 m)     Head Circumference --      Peak Flow --      Pain Score 09/15/24 1125 7     Pain Loc --      Pain Education --      Exclude from Growth Chart --    No data found.  Updated Vital Signs BP 100/73 (BP Location: Right Arm)   Pulse 78   Temp 99.2 F (37.3 C) (Oral)   Resp 17   Ht 5' 7 (1.702 m)   SpO2 97%   BMI 23.49 kg/m   Visual Acuity Right Eye Distance:   Left Eye Distance:   Bilateral Distance:    Right Eye Near:   Left Eye Near:    Bilateral Near:     Physical Exam Vitals reviewed.  Constitutional:      General: He is awake. He is not in acute distress.    Appearance: Normal appearance. He is well-developed. He is not ill-appearing, toxic-appearing or diaphoretic.  HENT:     Head: Normocephalic.     Right Ear: Hearing normal.     Left Ear: Hearing normal.     Nose: Nose normal.     Mouth/Throat:     Mouth: Mucous membranes are moist.  Eyes:     General: Lids are everted, no foreign bodies appreciated. Vision grossly intact. Gaze aligned appropriately. No visual field deficit.       Left eye: No foreign body or discharge.     Extraocular Movements: Extraocular movements intact.     Conjunctiva/sclera: Conjunctivae normal.     Left eye: Left conjunctiva is not injected.     Comments: Mild upper eyelid edema. No erythema   Cardiovascular:     Rate and Rhythm: Normal rate and regular rhythm.     Heart sounds: Normal heart sounds.  Pulmonary:     Effort: Pulmonary effort is normal.     Breath sounds: Normal breath sounds and air entry.  Musculoskeletal:        General: Normal range of motion.     Cervical back: Normal range of motion and neck supple.  Skin:    General: Skin is warm and dry.  Neurological:     General: No focal deficit present.     Mental Status: He is alert and oriented to person, place, and time.  Psychiatric:         Speech: Speech normal.        Behavior: Behavior is cooperative.      UC Treatments / Results  Labs (all labs ordered are listed, but only abnormal results are displayed) Labs Reviewed - No data to display  EKG   Radiology  No results found.  Procedures Procedures (including critical care time)  Medications Ordered in UC Medications - No data to display  Initial Impression / Assessment and Plan / UC Course  I have reviewed the triage vital signs and the nursing notes.  Pertinent labs & imaging results that were available during my care of the patient were reviewed by me and considered in my medical decision making (see chart for details).     Patient presents with ocular symptoms including itching, discharge, morning crusting with yellow discoloration, and mild periocular swelling without associated pain, photophobia, or visual changes. Clinical presentation is consistent with conjunctivitis. Etiology is unclear as viral and bacterial conjunctivitis are difficult to differentiate clinically; however, bacterial conjunctivitis remains in the differential given the purulent nature of the discharge. Treatment was initiated with Polytrim ophthalmic drops. The patient was advised to use over-the-counter lubricating eye drops as needed for comfort and irritation relief and to maintain strict hand hygiene before and after touching the eyes to prevent spread or reinfection. The patient was instructed to follow up with primary care or ophthalmology if symptoms fail to improve, worsen, or if new symptoms develop including increased pain, light sensitivity, visual changes, or increasing redness or swelling.  Today's evaluation has revealed no signs of a dangerous process. Discussed diagnosis with patient and/or guardian. Patient and/or guardian aware of their diagnosis, possible red flag symptoms to watch out for and need for close follow up. Patient and/or guardian understands verbal and written  discharge instructions. Patient and/or guardian comfortable with plan and disposition.  Patient and/or guardian has a clear mental status at this time, good insight into illness (after discussion and teaching) and has clear judgment to make decisions regarding their care  Documentation was completed with the aid of voice recognition software. Transcription may contain typographical errors.  Final Clinical Impressions(s) / UC Diagnoses   Final diagnoses:  Acute conjunctivitis of left eye, unspecified acute conjunctivitis type     Discharge Instructions      You were seen today for eye symptoms including itching, discharge, and morning crusting, which are consistent with conjunctivitis ("pink eye"). Because viral and bacterial conjunctivitis can look very similar, and your symptoms include thicker yellow discharge, treatment was started with Polytrim eye drops. Use the drops exactly as prescribed and complete the full course, even if your symptoms begin to improve sooner. You may also use the prescribed Naphcon-A drops as needed to soothe dryness, irritation, or burning.  To help prevent spreading infection or reinfection, wash your hands before and after touching your eyes, avoid rubbing the eyes, do not share towels, makeup, or eye products, and discard any eye makeup used before symptoms began. Avoid contact lens wear until symptoms have fully resolved and any prescribed treatment is completed. You can gently clean crusting from the eyelids in the morning using a clean, warm, damp cloth.  Follow up with your primary care provider or an eye specialist if your symptoms have not improved within 3-5 days of starting treatment, if redness or discharge worsens, or if symptoms return after completing medication. Seek emergency care immediately if you develop eye pain, sensitivity to light, blurred or decreased vision, rapidly worsening swelling or redness around the eye, difficulty opening the eye, or  any sudden changes in vision, as these can indicate a more serious eye condition requiring urgent evaluation.     ED Prescriptions     Medication Sig Dispense Auth. Provider   naphazoline-pheniramine (NAPHCON-A) 0.025-0.3 % ophthalmic solution Place 1  drop into the left eye 4 (four) times daily as needed for eye irritation. 15 mL Iola Lukes, FNP   trimethoprim-polymyxin b (POLYTRIM) ophthalmic solution Place 1 drop into the left eye every 3 (three) hours while awake. 10 mL Iola Lukes, FNP      PDMP not reviewed this encounter.   Iola Lukes, [place] 09/15/24 [phone]
